# Patient Record
Sex: Male | Born: 1990 | Hispanic: Yes | Marital: Single | Smoking: Current every day smoker
Health system: Southern US, Community
[De-identification: ages and names within clinical notes are randomized; demographics above are authoritative.]

## PROBLEM LIST (undated history)

## (undated) DIAGNOSIS — F1110 Opioid abuse, uncomplicated: Secondary | ICD-10-CM

## (undated) DIAGNOSIS — F1910 Other psychoactive substance abuse, uncomplicated: Secondary | ICD-10-CM

## (undated) DIAGNOSIS — F419 Anxiety disorder, unspecified: Secondary | ICD-10-CM

## (undated) DIAGNOSIS — F329 Major depressive disorder, single episode, unspecified: Secondary | ICD-10-CM

## (undated) DIAGNOSIS — J45909 Unspecified asthma, uncomplicated: Secondary | ICD-10-CM

## (undated) DIAGNOSIS — F32A Depression, unspecified: Secondary | ICD-10-CM

## (undated) HISTORY — DX: Anxiety disorder, unspecified: F41.9

## (undated) HISTORY — DX: Depression, unspecified: F32.A

---
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
## 2011-07-24 ENCOUNTER — Encounter (HOSPITAL_COMMUNITY): Payer: Self-pay | Admitting: *Deleted

## 2011-07-24 ENCOUNTER — Emergency Department (HOSPITAL_COMMUNITY): Payer: Self-pay

## 2011-07-24 ENCOUNTER — Emergency Department (HOSPITAL_COMMUNITY)
Admission: EM | Admit: 2011-07-24 | Discharge: 2011-07-24 | Disposition: A | Discharge status: Home / Self Care | Payer: Self-pay | Attending: Emergency Medicine | Admitting: Emergency Medicine

## 2011-07-24 DIAGNOSIS — S91311A Laceration without foreign body, right foot, initial encounter: Secondary | ICD-10-CM

## 2011-07-24 DIAGNOSIS — F17200 Nicotine dependence, unspecified, uncomplicated: Secondary | ICD-10-CM | POA: Insufficient documentation

## 2011-07-24 DIAGNOSIS — Z23 Encounter for immunization: Secondary | ICD-10-CM | POA: Insufficient documentation

## 2011-07-24 DIAGNOSIS — S91309A Unspecified open wound, unspecified foot, initial encounter: Secondary | ICD-10-CM | POA: Insufficient documentation

## 2011-07-24 DIAGNOSIS — W268XXA Contact with other sharp object(s), not elsewhere classified, initial encounter: Secondary | ICD-10-CM | POA: Insufficient documentation

## 2011-07-24 MED ORDER — CEPHALEXIN 250 MG PO CAPS
500.0000 mg | ORAL_CAPSULE | Freq: Once | ORAL | Status: AC
  Administered 2011-07-24: 500 mg via ORAL
  Filled 2011-07-24: qty 2

## 2011-07-24 MED ORDER — TETANUS-DIPHTH-ACELL PERTUSSIS 5-2.5-18.5 LF-MCG/0.5 IM SUSP
0.5000 mL | Freq: Once | INTRAMUSCULAR | Status: AC
  Administered 2011-07-24: 0.5 mL via INTRAMUSCULAR
  Filled 2011-07-24: qty 0.5

## 2011-07-24 MED ORDER — CEPHALEXIN 500 MG PO CAPS: 500.0000 mg | ORAL_CAPSULE | Freq: Four times a day (QID) | ORAL | Status: AC

## 2011-07-24 NOTE — ED Notes (Signed)
Pt reports stepping on piece of glass pta. Pt with active bleeding from posterior aspect of foot. Denies pain. States it feels numb.

## 2011-07-24 NOTE — ED Provider Notes (Signed)
History    This chart was scribed for Aaron Roots, MD, MD by Smitty Pluck. The patient was seen in room TR10C and the patient's care was started at 4:46PM.   CSN: [id number]  Arrival date & time 07/24/11  1615   None     Chief Complaint  Patient presents with  . Extremity Laceration    (Consider location/radiation/quality/duration/timing/severity/associated sxs/prior treatment) The history is provided by the patient.   Aaron Blankenship is a 21 y.o. male who presents to the Emergency Department complaining of moderate right foot laceration due to steeping on glass at home today. Pt reports that he does not think glass is still in his foot. Symptoms have been constant since onset. Denies radiation. Pt has active bleeding.  Denies allergies to medication.   History reviewed. No pertinent past medical history.  History reviewed. No pertinent past surgical history.  History reviewed. No pertinent family history.  History  Substance Use Topics  . Smoking status: Current Some Day Smoker  . Smokeless tobacco: Not on file  . Alcohol Use: Yes     occ      Review of Systems  Constitutional: Negative for fever and chills.  Respiratory: Negative for shortness of breath.   Gastrointestinal: Negative for nausea and vomiting.  Neurological: Negative for weakness.    Allergies  Review of patient's allergies indicates no known allergies.  Home Medications  No current outpatient prescriptions on file.  BP 113/78  Pulse 85  Temp 98.3 F (36.8 C) (Oral)  Resp 16  SpO2 100%  Physical Exam  Nursing note and vitals reviewed. Constitutional: He is oriented to person, place, and time. He appears well-developed and well-nourished. No distress.  HENT:  Head: Normocephalic and atraumatic.  Eyes: Conjunctivae are normal.  Neck: Neck supple. No tracheal deviation present.  Cardiovascular: Normal rate.   Pulmonary/Chest: Effort normal. No respiratory distress.  Musculoskeletal:  Normal range of motion.  Neurological: He is alert and oriented to person, place, and time.  Skin: Skin is warm and dry.       3 cm laceration of right foot proximal to base of 2nd and 3rd toes  Psychiatric: He has a normal mood and affect. His behavior is normal.    ED Course  Procedures (including critical care time) DIAGNOSTIC STUDIES: Oxygen Saturation is 100% on room air, normal by my interpretation.   5:22PM EDP ordered medication: boostrix 0.5 ml, keflex 500 mg   Dg Foot Complete Right  07/24/2011  *RADIOLOGY REPORT*  Clinical Data: Laceration to the base of the second and third toes.  RIGHT FOOT COMPLETE - 3+ VIEW  Comparison: No priors.  Findings: Three views of the right foot demonstrate no acute fracture, subluxation, dislocation, joint or soft tissue abnormality.  Specifically, no retained radiopaque foreign body is identified.  IMPRESSION: 1.  No acute radiographic abnormality of the right foot.  Original Report Authenticated By: Florencia Reasons, M.D.         MDM  I personally performed the services described in this documentation, which was scribed in my presence. The recorded information has been reviewed and considered. Aaron Roots, MD     LACERATION REPAIR Performed by: Aaron Blankenship Authorized by: Aaron Blankenship Consent: Verbal consent obtained. Risks and benefits: risks, benefits and alternatives were discussed Consent given by: patient Patient identity confirmed: provided demographic data Prepped and Draped in normal sterile fashion Wound explored  Laceration Location: right foot  Laceration Length: 4 cm  No Foreign Bodies seen  or palpated  Anesthesia: local infiltration  Local anesthetic: lidocaine 2% wo epinephrine  Anesthetic total: 5 ml  Irrigation method: syringe Amount of cleaning: standard  Skin closure: 4-0 prolene  Number of sutures: 9  Technique: simple interrupted  Patient tolerance: Patient tolerated the procedure well  with no immediate complications.   Confirmed nkda. Keflex po. Tetanus im.     Aaron Roots, MD 07/24/11 [phone]

## 2011-07-24 NOTE — ED Notes (Signed)
Pt reports stepping on glass today.  Noted to have lacerations across the bottom of all toes.  Bleeding controlled.  Pt has sensation and movement.

## 2013-10-30 ENCOUNTER — Encounter (HOSPITAL_COMMUNITY): Payer: Self-pay | Admitting: Emergency Medicine

## 2013-10-30 ENCOUNTER — Emergency Department (HOSPITAL_COMMUNITY)
Admission: EM | Admit: 2013-10-30 | Discharge: 2013-10-31 | Disposition: A | Discharge status: Home / Self Care | Payer: Self-pay | Attending: Emergency Medicine | Admitting: Emergency Medicine

## 2013-10-30 ENCOUNTER — Emergency Department (HOSPITAL_COMMUNITY): Payer: Self-pay

## 2013-10-30 DIAGNOSIS — R0602 Shortness of breath: Secondary | ICD-10-CM

## 2013-10-30 DIAGNOSIS — J069 Acute upper respiratory infection, unspecified: Secondary | ICD-10-CM | POA: Insufficient documentation

## 2013-10-30 DIAGNOSIS — Z720 Tobacco use: Secondary | ICD-10-CM | POA: Insufficient documentation

## 2013-10-30 LAB — CBC WITH DIFFERENTIAL/PLATELET
Basophils Absolute: 0 10*3/uL (ref 0.0–0.1)
Basophils Relative: 0 % (ref 0–1)
EOS ABS: 0.2 10*3/uL (ref 0.0–0.7)
EOS PCT: 2 % (ref 0–5)
HEMATOCRIT: 43.3 % (ref 39.0–52.0)
HEMOGLOBIN: 15 g/dL (ref 13.0–17.0)
LYMPHS ABS: 3.3 10*3/uL (ref 0.7–4.0)
Lymphocytes Relative: 45 % (ref 12–46)
MCH: 28.9 pg (ref 26.0–34.0)
MCHC: 34.6 g/dL (ref 30.0–36.0)
MCV: 83.4 fL (ref 78.0–100.0)
MONO ABS: 0.5 10*3/uL (ref 0.1–1.0)
Monocytes Relative: 6 % (ref 3–12)
Neutro Abs: 3.5 10*3/uL (ref 1.7–7.7)
Neutrophils Relative %: 47 % (ref 43–77)
PLATELETS: 257 10*3/uL (ref 150–400)
RBC: 5.19 MIL/uL (ref 4.22–5.81)
RDW: 12.8 % (ref 11.5–15.5)
WBC: 7.5 10*3/uL (ref 4.0–10.5)

## 2013-10-30 LAB — COMPREHENSIVE METABOLIC PANEL
ALT: 27 U/L (ref 0–53)
ANION GAP: 15 (ref 5–15)
AST: 27 U/L (ref 0–37)
Albumin: 4.3 g/dL (ref 3.5–5.2)
Alkaline Phosphatase: 95 U/L (ref 39–117)
BUN: 18 mg/dL (ref 6–23)
CALCIUM: 10 mg/dL (ref 8.4–10.5)
CO2: 25 mEq/L (ref 19–32)
CREATININE: 1.21 mg/dL (ref 0.50–1.35)
Chloride: 98 mEq/L (ref 96–112)
GFR calc non Af Amer: 84 mL/min — ABNORMAL LOW (ref >90)
GLUCOSE: 107 mg/dL — AB (ref 70–99)
Potassium: 3.9 mEq/L (ref 3.7–5.3)
SODIUM: 138 meq/L (ref 137–147)
TOTAL PROTEIN: 8 g/dL (ref 6.0–8.3)
Total Bilirubin: 0.6 mg/dL (ref 0.3–1.2)

## 2013-10-30 MED ORDER — BENZONATATE 100 MG PO CAPS
200.0000 mg | ORAL_CAPSULE | Freq: Once | ORAL | Status: AC
  Administered 2013-10-30: 200 mg via ORAL
  Filled 2013-10-30: qty 2

## 2013-10-30 NOTE — ED Notes (Signed)
Pt. reports SOB , occasional productive cough , dizziness onset today , denies fever or chills.

## 2013-10-30 NOTE — ED Provider Notes (Signed)
CSN: [id number]     Arrival date & time 10/30/13  2222 History   First MD Initiated Contact with Patient 10/30/13 2303     Chief Complaint  Patient presents with  . Shortness of Breath  . Dizziness     (Consider location/radiation/quality/duration/timing/severity/associated sxs/prior Treatment) Patient is a 23 y.o. male presenting with cough. The history is provided by the patient.  Cough Cough characteristics:  Productive Sputum characteristics:  Nondescript Severity:  Moderate Onset quality:  Gradual Duration:  4 days Timing:  Intermittent Progression:  Unchanged Chronicity:  New Smoker: yes   Context: upper respiratory infection   Relieved by:  Nothing Worsened by:  Nothing tried Ineffective treatments:  None tried Associated symptoms: rhinorrhea   Associated symptoms: no chest pain, no diaphoresis and no fever   Risk factors: no recent travel   No swelling in the legs no long car trips or plane trips.  Has had nasal congestion and runny nose.    History reviewed. No pertinent past medical history. History reviewed. No pertinent past surgical history. No family history on file. History  Substance Use Topics  . Smoking status: Current Some Day Smoker  . Smokeless tobacco: Not on file  . Alcohol Use: Yes     Comment: occ    Review of Systems  Constitutional: Negative for fever and diaphoresis.  HENT: Positive for rhinorrhea.   Respiratory: Positive for cough.   Cardiovascular: Negative for chest pain, palpitations and leg swelling.  Gastrointestinal: Negative for abdominal distention.  All other systems reviewed and are negative.     Allergies  Review of patient's allergies indicates no known allergies.  Home Medications   Prior to Admission medications   Not on File   BP 134/88  Pulse 81  Temp(Src) 98 F (36.7 C) (Oral)  Resp 20  Ht 5\' 9"  (1.753 m)  Wt 170 lb (77.111 kg)  BMI 25.09 kg/m2  SpO2 100% Physical Exam  Constitutional: He is oriented  to person, place, and time. He appears well-nourished. No distress.  HENT:  Head: Normocephalic and atraumatic.  Mouth/Throat: Oropharynx is clear and moist. No oropharyngeal exudate.  Nasal crusting  Eyes: Conjunctivae and EOM are normal. Pupils are equal, round, and reactive to light.  Neck: Normal range of motion. Neck supple.  Cardiovascular: Normal rate, regular rhythm and intact distal pulses.   Pulmonary/Chest: Effort normal and breath sounds normal. No respiratory distress. He has no wheezes. He has no rales. He exhibits no tenderness.  Abdominal: Soft. Bowel sounds are normal. There is no tenderness. There is no rebound and no guarding.  Musculoskeletal: Normal range of motion.  Neurological: He is alert and oriented to person, place, and time.  Skin: Skin is warm and dry.  Psychiatric: He has a normal mood and affect.    ED Course  Procedures (including critical care time) Labs Review Labs Reviewed  CBC WITH DIFFERENTIAL  COMPREHENSIVE METABOLIC PANEL    Imaging Review No results found.   EKG Interpretation   Date/Time:  Thursday October 30 2013 22:28:49 EDT Ventricular Rate:  78 PR Interval:  162 QRS Duration: 88 QT Interval:  354 QTC Calculation: 403 R Axis:   83 Text Interpretation:  Normal sinus rhythm with sinus arrhythmia Confirmed  by [facility] IncALUMBO-RASCH  MD, Ermon Sagan ([id number]) on 10/30/2013 11:03:03 PM      MDM   Final diagnoses:  SOB (shortness of breath)  PERC negative wells 0 highly doubt PE.  Highly doubt ACS in a young healthy male.  Symptoms consistent with URI.  Will treat for same.  Return for CP sob n/v/d.  Swelling in the legs or any concerns.  Follow up with your PMD for recheck   Marti Acebo K Neven Fina-Rasch, MD 10/31/13 [phone]

## 2013-10-31 MED ORDER — FLUTICASONE PROPIONATE 50 MCG/ACT NA SUSP: 2.0000 | Freq: Every day | NASAL | Status: DC

## 2013-10-31 MED ORDER — CETIRIZINE HCL 10 MG PO CAPS: 1.0000 | ORAL_CAPSULE | Freq: Every morning | ORAL | Status: DC

## 2013-10-31 NOTE — ED Notes (Signed)
Pt. Refused wheelchair 

## 2013-10-31 NOTE — Discharge Instructions (Signed)
Vaporizadores de Soil scientistaire fro Clinical research associate([employer]apor[employer]) Los vaporizadores ayudan a Paramedicaliviar los sntomas de la tos y Metallurgistel resfro. Agregan humedad al aire, lo que fluidifica el moco y lo hace menos espeso. Facilitan la respiracin y favorecen la eliminacin de secreciones. Los vaporizadores de aire fro no provocan quemaduras serias Lubrizol Corporationcomo los de aire caliente, que tambin se llaman humidificadores. No se ha probado que los vaporizadores mejoren el resfro. No debe usar un vaporizador si es Pharmacologistalrgico al moho. INSTRUCCIONES PARA EL CUIDADO EN EL HOGAR  Siga las instrucciones para el uso del vaporizador que se encuentran en la caja.  Use solamente agua destilada en el vaporizador.  No use el vaporizador en forma continua. Esto puede formar moho o hacer que se desarrollen bacterias en el vaporizador.  Limpie el vaporizador cada vez que se use.  Lmpielo y squelo bien antes de guardarlo.  Deje de usarlo si los sntomas respiratorios empeoran. Document Released: 08/28/2012 Document Revised: 12/31/2012 [facility] Patient Information 2015 [place]ExitCare, MarylandLL[place]. This information is not intended to replace advice given to you by your health care provider. Make sure you discuss any questions you have with your health care provider.  Tos - Adultos  (Cough, Adult)  La tos es un reflejo. Ayuda a limpiar la garganta y las vas areas. Ayuda a que el organismo se cure. La tos puede durar entre 2  3 semanas (aguda) o puede durar ms de 8 semanas (crnica) Las causas ms frecuentes son las infecciones, las alergias o los resfros.  CUIDADOS EN EL HOGAR   Slo tome la medicacin segn las indicaciones.  Si le indican medicamentos (antibiticos), tmelos tal como se le indic. Tmelos todos, aunque se sienta mejor.  Coloque un vaporizador o humidificador de niebla fra en su casa. Esto puede ayudar a Runner, broadcasting/film/videoaflojar el moco espeso (secreciones).  Duerma de modo que quede casi sentado (semi erguido). Use almohadas para lograr esta  posicin. Esto ayuda a disminuir la tos.  Descanse todo lo que pueda.  Si fuma, abandone el hbito. SOLICITE AYUDA DE INMEDIATO SI:   Observa una secrecin de color blanco amarillento (pus) en el moco.  La tos empeora.  El medicamento no disminuye la tos y no puede dormir.  Tose y escupe sangre.  Tiene dificultad para respirar.  Siente un dolor ms intenso y los medicamentos no Winn-Dixiehacen efecto.  Tiene fiebre. ASEGRESE DE QUE:   Comprende estas instrucciones.  Controlar su enfermedad.  Solicitar ayuda de inmediato si no mejora o si empeora. Document Released: 09/08/2010 Document Revised: 03/20/2011 [facility]ExitCare Patient Information 2015 [place]ExitCare, [place]LLC. This information is not intended to replace advice given to you by your health care provider. Make sure you discuss any questions you have with your health care provider.

## 2014-02-04 ENCOUNTER — Ambulatory Visit (INDEPENDENT_AMBULATORY_CARE_PROVIDER_SITE_OTHER): Payer: Self-pay | Admitting: Family Medicine

## 2014-02-04 VITALS — BP 120/70 | HR 75 | Temp 98.2°F | Resp 16 | Ht 69.0 in | Wt 169.4 lb

## 2014-02-04 DIAGNOSIS — F32A Depression, unspecified: Secondary | ICD-10-CM

## 2014-02-04 DIAGNOSIS — F1910 Other psychoactive substance abuse, uncomplicated: Secondary | ICD-10-CM

## 2014-02-04 DIAGNOSIS — F329 Major depressive disorder, single episode, unspecified: Secondary | ICD-10-CM

## 2014-02-04 DIAGNOSIS — F419 Anxiety disorder, unspecified: Principal | ICD-10-CM

## 2014-02-04 DIAGNOSIS — F418 Other specified anxiety disorders: Secondary | ICD-10-CM

## 2014-02-04 DIAGNOSIS — F1020 Alcohol dependence, uncomplicated: Secondary | ICD-10-CM

## 2014-02-04 MED ORDER — SERTRALINE HCL 50 MG PO TABS: ORAL_TABLET | ORAL | Status: DC

## 2014-02-04 MED ORDER — HYDROXYZINE HCL 25 MG PO TABS: 25.0000 mg | ORAL_TABLET | Freq: Three times a day (TID) | ORAL | Status: DC | PRN

## 2014-02-04 NOTE — Progress Notes (Signed)
Chief Complaint:  Chief Complaint  Patient presents with  . Insomnia  . Neck Pain  . Fatigue  . Anxiety    HPI: Aaron Blankenship is a 24 y.o. male who is here for  Insomnia, does not have any energy to do anything, he is thinking too much and cannot focus and can't sleep. He is stressed and he does not know why. His mind just won;t stop thinking , alittle bit of everything, his mom and sister have anxiety and depression. His sister was taking meds for it but he doe snot know. He is getting about 6 hours for the last 2-3 weeks. He has a hard time falling asleep and then when goes to sleep he will sleep for 2-3 hours and then wakes up He does not want to go to work and does not want to leave her room. He is not hanging out with his friend. He works in Dietitian. He used to not want to be at home. He  Got paind and lost 200 dollars and did not know what happened, he was crying and could not stop crying.  He does not gamble He drinks only on the weekends He drinks until he passes out, he drinks because it is the only time he feels good.  He used to be in a gang, he is not in contact with any of them, they are gone from [place].  He does not have GF, he doe snot have a lot of friends he hangs out with, he has 2 parents and 2 younger siblings He thinks everything pisses him off He stopped going to work because of this.  He is not feeling very well. He is having crying outburst. He used to go to the gym but not anymore before all this.  He has used drugs in the past, cocaine, weed. He is using weed regular 1-2 times a week. He spends most of his money on alcohol.  Last time he used cociane was 1 week ago. He uses cocaine about regular.  History reviewed. No pertinent past medical history. History reviewed. No pertinent past surgical history. History   Social History  . Marital Status: Single    Spouse Name: N/A    Number of Children: N/A  . Years of Education: N/A   Social History  Main Topics  . Smoking status: Current Some Day Smoker  . Smokeless tobacco: Never Used  . Alcohol Use: 0.0 oz/week    0 Not specified per week     Comment: occ  . Drug Use: No  . Sexual Activity: None   Other Topics Concern  . None   Social History Narrative   Family History  Problem Relation Age of Onset  . Depression Father   . Depression Sister    No Known Allergies Prior to Admission medications   Not on File     ROS: The patient denies fevers, chills, night sweats, unintentional weight loss, chest pain, palpitations, wheezing, dyspnea on exertion, nausea, vomiting, abdominal pain, dysuria, hematuria, melena, numbness, weakness, or tingling.   All other systems have been reviewed and were otherwise negative with the exception of those mentioned in the HPI and as above.    PHYSICAL EXAM: Filed Vitals:   02/04/14 1752  BP: 120/70  Pulse: 75  Temp: 98.2 F (36.8 C)  Resp: 16   Filed Vitals:   02/04/14 1752  Height:  (1.753 m)  Weight: 169 lb 6 oz (76.828 kg)  Body mass index is 25 kg/(m^2).  General: Alert, no acute distress HEENT:  Normocephalic, atraumatic, oropharynx patent. EOMI, PERRLA Cardiovascular:  Regular rate and rhythm, no rubs murmurs or gallops.  No Carotid bruits, radial pulse intact. No pedal edema.  Respiratory: Clear to auscultation bilaterally.  No wheezes, rales, or rhonchi.  No cyanosis, no use of accessory musculature GI: No organomegaly, abdomen is soft and non-tender, positive bowel sounds.  No masses. Skin: No rashes. Neurologic: Facial musculature symmetric. Psychiatric: Patient is appropriate throughout our interaction but would burst out into tears.  Lymphatic: No cervical lymphadenopathy Musculoskeletal: Gait intact.   LABS: Results for orders placed or performed during the hospital encounter of 10/30/13  CBC with Differential  Result Value Ref Range   WBC 7.5 4.0 - 10.5 K/uL   RBC 5.19 4.22 - 5.81 MIL/uL   Hemoglobin  15.0 13.0 - 17.0 g/dL   HCT 16.143.3 09.639.0 - 04.552.0 %   MCV 83.4 78.0 - 100.0 fL   MCH 28.9 26.0 - 34.0 pg   MCHC 34.6 30.0 - 36.0 g/dL   RDW 40.912.8 81.111.5 - 91.415.5 %   Platelets 257 150 - 400 K/uL   Neutrophils Relative % 47 43 - 77 %   Neutro Abs 3.5 1.7 - 7.7 K/uL   Lymphocytes Relative 45 12 - 46 %   Lymphs Abs 3.3 0.7 - 4.0 K/uL   Monocytes Relative 6 3 - 12 %   Monocytes Absolute 0.5 0.1 - 1.0 K/uL   Eosinophils Relative 2 0 - 5 %   Eosinophils Absolute 0.2 0.0 - 0.7 K/uL   Basophils Relative 0 0 - 1 %   Basophils Absolute 0.0 0.0 - 0.1 K/uL  Comprehensive metabolic panel  Result Value Ref Range   Sodium 138 137 - 147 mEq/L   Potassium 3.9 3.7 - 5.3 mEq/L   Chloride 98 96 - 112 mEq/L   CO2 25 19 - 32 mEq/L   Glucose, Bld 107 (H) 70 - 99 mg/dL   BUN 18 6 - 23 mg/dL   Creatinine, Ser 7.821.21 0.50 - 1.35 mg/dL   Calcium 95.610.0 8.4 - 21.310.5 mg/dL   Total Protein 8.0 6.0 - 8.3 g/dL   Albumin 4.3 3.5 - 5.2 g/dL   AST 27 0 - 37 U/L   ALT 27 0 - 53 U/L   Alkaline Phosphatase 95 39 - 117 U/L   Total Bilirubin 0.6 0.3 - 1.2 mg/dL   GFR calc non Af Amer 84 (L) >90 mL/min   GFR calc Af Amer >90 >90 mL/min   Anion gap 15 5 - 15     EKG/XRAY:   [facility] Normla sinus brady EKG at 54 bpm   ASSESSMENT/PLAN: Encounter Diagnoses  Name Primary?  . Substance abuse   . Uncomplicated alcohol dependence   . 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 Deneis mania.  Rx zoloft nightly  and vistaril prn  Gave resources to go to substance abuse counseling F/u in 6 weeks or prn  Denies any  SI/HI/ Hallucinations, precautions to go to ER prn EKG was WNL sinusbrady  Gross sideeffects, risk and benefits, and alternatives of medications d/w patient. Patient is  aware that all medications have potential sideeffects and we  are unable to predict every sideeffect or drug-drug interaction that may occur.  Hamilton Capri PHUONG, DO 02/04/2014 6:41 PM

## 2014-02-04 NOTE — Patient Instructions (Signed)
RESOURCE GUIDE ° °Chronic Pain Problems: °Contact [facility]  [phone] °Patients need to be referred by their primary care doctor. ° °Insufficient Money for Medicine: °Contact United Way:  call (888) 892-1162 ° °No Primary Care Doctor: °- Call [facility]  [phone] - can help you locate a primary care doctor that  accepts your insurance, provides certain services, etc. °- Physician Referral Service- 1-800-533-3463 ° °Agencies that provide inexpensive medical care: °- [facility]  [phone] °- [facility]  [phone] °- [facility]  [phone] °- [facility]  832-4777 °- [facility]  [phone] °- [facility]  [phone] ° °Medicaid-accepting [place] Providers: °- [facility]- [address] ° [phone], Mon-Fri 9am-7pm, Sat 9am-1pm °- [facility]- [address], [address] ° [phone] °- [facility]- [address], [address] ° [phone] °- [facility]- [address] [place] [address] ° [phone] °- [facility]- [address], [phone] ° Only accepts [place] Access Medicaid patients after they have their name  applied to their card ° °Self Pay (no insurance) in [place]: °- Sickle Cell Patients - [facility] ° [address], [phone] °- [facility]- [address] ° [phone] °      -     [facility]- [address] °      -     [facility]- see information above (Speak to Pam H if you do not have insurance) °      -  [facility] [place]- [address],  [phone] °      -  [facility]- [address] [place] [address], [phone] °      -  Dr Osei-Bonsu-  [address], [place], [phone] °      -  [facility] - [address], [phone] °      -  [facility]- [address] [place] [address], [phone], also  [address], [phone] °      -     [facility]- [address], [phone], 1st & 3rd Saturday °        every month, 10am-1pm ° -     [facility] °  [address], [place]. °  Phone:  [phone], Fax:  [phone]. Hours of Operation:  9 am - 6 pm, M-F. ° -     [facility] °  [address], [place] °  Phone: [phone], Fax: [phone]. Hours of Operation:  8:30 am - 5:30 pm, M-F. ° °[facility] °[address] °[place], [place] [address] °(336) [phone] ° °[facility] °[address] °[place], [place] [address] °(336) [phone] ° °1) Find a Doctor and Pay Out of Pocket °Although you won't have to find out who is covered by your insurance plan, it is a good idea to ask around and get recommendations. You will then need to call the office and see if the doctor you have chosen will accept you as a new patient and what types of options they offer for patients who are self-pay. Some doctors offer discounts or will set up payment plans for their patients who do not have insurance, but   you will need to ask so you aren't surprised when you get to your appointment. ° °2) Contact Your Local Health Department °Not all health departments have doctors that can see patients for sick visits, but many do, so it is worth a call to see if yours does. If you don't know where your local health department is, you can check in your phone book. The CDC also has a tool to help you locate your state's health department, and many state websites also have listings of all of their local health departments. ° °3) Find a Walk-in Clinic °If your illness is not likely to be very severe or complicated, you may want to try a walk in clinic. These are popping up all over the country in pharmacies, drugstores, and shopping centers. They're usually staffed by nurse practitioners or physician assistants that have been trained  to treat common illnesses and complaints. They're usually fairly quick and inexpensive. However, if you have serious medical issues or chronic medical problems, these are probably not your best option ° °STD Testing °- [place] [facility] , [facility], [address], [place], phone [phone] or 1-877-539-9860.  Monday - Friday, call for an appointment. °- [place] [facility] [place], [address], [place], phone [phone] or 1-877-539-9860.  Monday - Friday, call for an appointment. ° °Abuse/Neglect: °- [place] Child Abuse Hotline (336) 641-3795 °- [place] Child Abuse Hotline 800-378-5315 (After Hours) ° °Emergency Shelter:  [facility] (336) 271-5985 ° °Maternity Homes: °- [facility] (336) 275-9566 °- [facility] (704) 372-4663 ° °MRSA Hotline #:   832-7006 ° °Dental Assistance °If unable to pay or uninsured, contact:  [place] [facility] to become qualified for the adult dental clinic. ° °Patients with Medicaid: [facility] °[address], [phone] °[address], [phone] ° °If unable to pay, or uninsured, contact [place] [facility] ([phone] in [place], 842-7733 in [place]) to become qualified for the adult dental clinic ° °[facility] °[address] °[place], [place] [address] °(336) 272-4177 °www.[facility].com ° °Other Low-Cost Community Dental Services: °- [facility]- [address], [place], [place], [address], 723-1848, Ext. [address], 2nd and 4th Thursday of the month at 6:30am.  10 clients each day by appointment, can sometimes see walk-in patients if someone does not show for an appointment. °- [facility]- [address], [place], [place], [address], [phone] °- [facility]- [address], [place], [place], [address], [phone] °- [place]  [facility]- 342-8273 °- [facility]- 703-3100 °- [facility]- 570-6415 ° °     Behavioral Health Resources in the Community ° °Intensive Outpatient Programs: °[facility]      °[address] °[place], [place] °336-878-6098 °Both a day and evening program °      °[facility]     °[address]        °[place], [place] [address] °336-832-9800        ° °[facility] °[address] °[place] °336-882-2125 ° °[place] [place] [facility] °ACCESS LINE: 1-800-853-5163 or 336-641-4981 °[address] °,  [address] °Http://www.[email or web address].com/services/adult.htm ° ° °Substance Abuse Resources: °- [facility]  336-882-2125 °- [facility] 336-784-9470 °- [facility] [place] [facility] 336-285-9073 °- [facility] 336-845-3988 °-   Residential & Outpatient Substance Abuse Program  800-659-3381 ° °Psychological Services: °- [facility]  832-9600 °- [facility]  378-7881 °- [facility], [address], [place], ACCESS LINE: 1-800-853-5163 or 336-641-4981, Http://www.[facility].com/services/adult.htm ° °Mobile Crisis Teams:         °                               °[facility]         °Mobile Crisis Care Unit °1-877-626-1772       °      °Assertive °[facility] °[address] [place] °336-834-9664 °                                        °[facility]vention[facility] °Sharon DeEsch °[address] °[place] °336-554-5454 ° °Self-Help/Support Groups: °[facility] [place] [facility] of support groups °373-1402 (call for more info) ° °Narcotics Anonymous (NA) °[facility] °[address] °[place] - 2 meetings at this location ° °Residential Treatment Programs:  °[facility]      °[address]        °[place]       °866-801-8205        ° °[facility] °[address] °[place], [place]  [address] °704-293-8524 ° °[facility]  °[address] °[place], [place] [address] °336-845-3988 °Admissions: 8am-3pm M-F ° °[facility]     °[address]        °[place], [place] [address]       °336-841-1104        ° °[facility] °[address] °[place], [place] °336-379-7146 ° °[facility] [place] [facility] °[address] °[place], [place] °336-285-9073 ° °[facility] - Intensive Outpatient      °[address]     °, [place]       °852-3033        ° °[facility]     °[address] °[place], [place] °877-615-2722 or 336-784-9470 ° °[facility] ([facility]), Medicaid °[address] °[place], [place] °336-227-7417 ° °[facility]                                               °[address] ° [place] °800-659-3381 ° °[place] [facility]: °[facility]- 1-888-581-9988              ° °General Therapy                                                °Julie Brannon, PhD        °[address]                                       °[place], [place] [address]         °336-349-5553   °Insurance ° °[facility]   °[address] °[place], [place] [address] °336-349-4454 ° °[facility] °[address] [place], [place] [address] °336-342-8316 °Insurance/Medicaid/sponsorship through [facility] ° °[facility]                                              °[address]                                        °[place], [place] [address]    °Therapy/tele-psych/case         °336-342-8316        °  °[facility] °[address]  ° [place], [place]  [address]  °Adolescent/group home/case management °336-349-2233  °                                         °Julia Brannon PhD       °General therapy       °Insurance   °336-951-0000        ° °Dr. Arfeen, Insurance, M-F °336- 349-4544 ° °[facility] [place]  [facility]  [employer] [place] [facility] °[address]                 [address]         [address]  °[place]                                               [place] °Phone:  349-3220                                  Phone:  342-7768                   Phone:  342-8140 ° °[facility], 342-8316 °- [place] [facility] - [facility]- 1-888-581-9988 °      -     [facility] in [place], [address], °            336-349-4454, Insurance ° °[place] Child Abuse Hotline °(336) 342-1394 or (336) 342-3537 (After Hours) ° ° °

## 2014-03-05 ENCOUNTER — Ambulatory Visit: Payer: Self-pay

## 2016-08-12 ENCOUNTER — Emergency Department (HOSPITAL_COMMUNITY)
Admission: EM | Admit: 2016-08-12 | Discharge: 2016-08-12 | Disposition: A | Discharge status: Home / Self Care | Payer: Self-pay | Attending: Emergency Medicine | Admitting: Emergency Medicine

## 2016-08-12 ENCOUNTER — Encounter (HOSPITAL_COMMUNITY): Payer: Self-pay | Admitting: Emergency Medicine

## 2016-08-12 ENCOUNTER — Emergency Department (HOSPITAL_COMMUNITY): Payer: Self-pay

## 2016-08-12 DIAGNOSIS — R112 Nausea with vomiting, unspecified: Secondary | ICD-10-CM | POA: Insufficient documentation

## 2016-08-12 DIAGNOSIS — R1030 Lower abdominal pain, unspecified: Secondary | ICD-10-CM | POA: Insufficient documentation

## 2016-08-12 DIAGNOSIS — Z87891 Personal history of nicotine dependence: Secondary | ICD-10-CM | POA: Insufficient documentation

## 2016-08-12 DIAGNOSIS — Z79899 Other long term (current) drug therapy: Secondary | ICD-10-CM | POA: Insufficient documentation

## 2016-08-12 DIAGNOSIS — R197 Diarrhea, unspecified: Secondary | ICD-10-CM | POA: Insufficient documentation

## 2016-08-12 LAB — COMPREHENSIVE METABOLIC PANEL
ALT: 23 U/L (ref 17–63)
AST: 23 U/L (ref 15–41)
Albumin: 4.9 g/dL (ref 3.5–5.0)
Alkaline Phosphatase: 82 U/L (ref 38–126)
Anion gap: 11 (ref 5–15)
BUN: 8 mg/dL (ref 6–20)
CHLORIDE: 104 mmol/L (ref 101–111)
CO2: 25 mmol/L (ref 22–32)
CREATININE: 1.02 mg/dL (ref 0.61–1.24)
Calcium: 9.8 mg/dL (ref 8.9–10.3)
GFR calc non Af Amer: >60 mL/min (ref >60)
Glucose, Bld: 163 mg/dL — ABNORMAL HIGH (ref 65–99)
Potassium: 3.2 mmol/L — ABNORMAL LOW (ref 3.5–5.1)
SODIUM: 140 mmol/L (ref 135–145)
Total Bilirubin: 0.4 mg/dL (ref 0.3–1.2)
Total Protein: 8.3 g/dL — ABNORMAL HIGH (ref 6.5–8.1)

## 2016-08-12 LAB — CBC
HCT: 46.1 % (ref 39.0–52.0)
Hemoglobin: 16.3 g/dL (ref 13.0–17.0)
MCH: 29.5 pg (ref 26.0–34.0)
MCHC: 35.4 g/dL (ref 30.0–36.0)
MCV: 83.4 fL (ref 78.0–100.0)
PLATELETS: 231 10*3/uL (ref 150–400)
RBC: 5.53 MIL/uL (ref 4.22–5.81)
RDW: 13.3 % (ref 11.5–15.5)
WBC: 16 10*3/uL — ABNORMAL HIGH (ref 4.0–10.5)

## 2016-08-12 LAB — LIPASE, BLOOD: LIPASE: 14 U/L (ref 11–51)

## 2016-08-12 LAB — URINALYSIS, ROUTINE W REFLEX MICROSCOPIC
Bilirubin Urine: NEGATIVE
GLUCOSE, UA: NEGATIVE mg/dL
HGB URINE DIPSTICK: NEGATIVE
Ketones, ur: NEGATIVE mg/dL
Leukocytes, UA: NEGATIVE
Nitrite: NEGATIVE
PROTEIN: NEGATIVE mg/dL
Specific Gravity, Urine: >1.046 — ABNORMAL HIGH (ref 1.005–1.030)
pH: 7 (ref 5.0–8.0)

## 2016-08-12 LAB — DIFFERENTIAL
Basophils Absolute: 0 10*3/uL (ref 0.0–0.1)
Basophils Relative: 0 %
EOS PCT: 0 %
Eosinophils Absolute: 0 10*3/uL (ref 0.0–0.7)
LYMPHS ABS: 1.8 10*3/uL (ref 0.7–4.0)
LYMPHS PCT: 11 %
MONO ABS: 0.6 10*3/uL (ref 0.1–1.0)
MONOS PCT: 4 %
Neutro Abs: 13.6 10*3/uL — ABNORMAL HIGH (ref 1.7–7.7)
Neutrophils Relative %: 85 %

## 2016-08-12 MED ORDER — IOPAMIDOL (ISOVUE-300) INJECTION 61%
INTRAVENOUS | Status: AC
  Filled 2016-08-12: qty 100

## 2016-08-12 MED ORDER — MORPHINE SULFATE (PF) 4 MG/ML IV SOLN
4.0000 mg | Freq: Once | INTRAVENOUS | Status: AC
  Administered 2016-08-12: 4 mg via INTRAVENOUS
  Filled 2016-08-12: qty 1

## 2016-08-12 MED ORDER — IOPAMIDOL (ISOVUE-300) INJECTION 61%
100.0000 mL | Freq: Once | INTRAVENOUS | Status: AC | PRN
  Administered 2016-08-12: 100 mL via INTRAVENOUS

## 2016-08-12 MED ORDER — ONDANSETRON HCL 4 MG PO TABS: 4.0000 mg | ORAL_TABLET | Freq: Three times a day (TID) | ORAL | 0 refills | Status: AC | PRN

## 2016-08-12 MED ORDER — SODIUM CHLORIDE 0.9 % IV BOLUS (SEPSIS)
1000.0000 mL | Freq: Once | INTRAVENOUS | Status: AC
  Administered 2016-08-12: 1000 mL via INTRAVENOUS

## 2016-08-12 MED ORDER — POTASSIUM CHLORIDE CRYS ER 20 MEQ PO TBCR
40.0000 meq | EXTENDED_RELEASE_TABLET | Freq: Once | ORAL | Status: AC
  Administered 2016-08-12: 40 meq via ORAL
  Filled 2016-08-12: qty 2

## 2016-08-12 MED ORDER — KETOROLAC TROMETHAMINE 30 MG/ML IJ SOLN
30.0000 mg | Freq: Once | INTRAMUSCULAR | Status: AC
  Administered 2016-08-12: 30 mg via INTRAVENOUS
  Filled 2016-08-12: qty 1

## 2016-08-12 MED ORDER — ONDANSETRON HCL 4 MG/2ML IJ SOLN
4.0000 mg | Freq: Once | INTRAMUSCULAR | Status: AC
  Administered 2016-08-12: 4 mg via INTRAVENOUS
  Filled 2016-08-12: qty 2

## 2016-08-12 NOTE — ED Triage Notes (Addendum)
Pt states he has had vomiting for the past 4 hrs  Pt states it is just liquid but it has been nonstop  Pt is c/o generalized abd pain  Pt heaving in triage and unable to hold still  Pt is diaphoretic in triage

## 2016-08-12 NOTE — Discharge Instructions (Signed)
Take Zofran as needed up to 3 times daily for nausea and vomiting. Drink plenty of fluids and get plenty of rest. Slowly advance her diet and eat a diet of bland foods that will not upset her stomach. You may find yogurt or probiotics helpful additionally. Follow-up with your primary care physician for reevaluation in the next 3-4 days. Return to the ED immediately for any concerning signs or symptoms develop such as persisting nausea and vomiting, fevers, severe pain, or blood in the urine or stool.

## 2016-08-12 NOTE — ED Provider Notes (Signed)
[facility]-EMERGENCY DEPT Provider Note   CSN: [id number] Arrival date & time: 08/12/16  [id number]     History   Chief Complaint Chief Complaint  Patient presents with  . Emesis    HPI Aaron Blankenship is a 26 y.o. male with no significant past medical history who presents today with chief complaint acute onset, waxing and waning abdominal pain since 2 AM this morning. Patient states he awoke suddenly with severe crampy lower abdominal pain that at times radiates to his left flank. He has had persistent nausea and vomiting since then. Emesis is nonbloody but bilious. Had 1 episode of watery loose stools while in the waiting room and states that it was red "but that might be because I eat a lot of spicy food with hot sauce ". Episodes will intensify for approximately 5 minutes before remitting somewhat. He denies dysuria, hematuria, or other urinary symptoms. He endorses subjective fevers and chills and states that he is unable to find a comfortable position. Has not been able to keep any food down today. Last oral intake was a pastry. No sick contacts. Wife states that they do eat a lot of fast food and "unhealthy food". No known history of kidney stones. HThe history is provided by the patient and the spouse.  Emesis   Associated symptoms include abdominal pain, chills, diarrhea and a fever.    History reviewed. No pertinent past medical history.  Patient Active Problem List   Diagnosis Date Noted  . ALLERGIC RHINITIS 09/07/2008  . UVEITIS 10/05/2005    History reviewed. No pertinent surgical history.     Home Medications    Prior to Admission medications   Medication Sig Start Date End Date Taking? Authorizing Provider  hydrOXYzine (ATARAX/VISTARIL) 25 MG tablet Take 1 tablet (25 mg total) by mouth 3 (three) times daily as needed for anxiety. Patient not taking: Reported on 08/12/2016 02/04/14   Le, Thao P, DO  ondansetron (ZOFRAN) 4 MG tablet Take 1 tablet (4 mg total) by mouth every 8  (eight) hours as needed for nausea or vomiting. 08/12/16 08/16/16  Michela PitcherFawze, Kapil Petropoulos A, PA-C  sertraline (ZOLOFT) 50 MG tablet Take 1/2 tab PO nightly for 2 days then 1 tab po at night daily Patient not taking: Reported on 08/12/2016 02/04/14   Lenell AntuLe, Thao P, DO    Family History Family History  Problem Relation Age of Onset  . Depression Father   . Depression Sister     Social History Social History  Substance Use Topics  . Smoking status: Former Games developermoker  . Smokeless tobacco: Never Used  . Alcohol use No     Allergies   Patient has no known allergies.   Review of Systems Review of Systems  Constitutional: Positive for chills, diaphoresis and fever.  Respiratory: Negative for shortness of breath.   Cardiovascular: Negative for chest pain.  Gastrointestinal: Positive for abdominal pain, diarrhea, nausea and vomiting.  Genitourinary: Positive for flank pain. Negative for dysuria, frequency and hematuria.  All other systems reviewed and are negative.    Physical Exam Updated Vital Signs BP 121/78 (BP Location: Left Arm)   Pulse 65   Temp 98 F (36.7 C) (Oral)   Resp 18   SpO2 100%   Physical Exam  Constitutional: He appears well-developed and well-nourished. He appears distressed.  Very uncomfortable in bed, cries out in pain occasionally, constantly changing positions  HENT:  Head: Normocephalic and atraumatic.  Eyes: Conjunctivae are normal. Right eye exhibits no discharge. Left eye exhibits  no discharge.  Neck: No JVD present. No tracheal deviation present.  Cardiovascular: Normal rate, regular rhythm and normal heart sounds.   Pulmonary/Chest: Effort normal and breath sounds normal.  Abdominal: Soft. He exhibits no distension. There is tenderness. There is guarding.  Maximally tender in the suprapubic region with some discomfort on RLQ and LLQ palpation, Murphy sign absent, CVA tenderness absent  Musculoskeletal: He exhibits no edema.  No midline spine TTP, no paraspinal  muscle tenderness, no deformity, crepitus, or step-off noted   Neurological: He is alert.  Skin: Skin is warm and dry. He is not diaphoretic. No erythema.  Psychiatric: He has a normal mood and affect. His behavior is normal.  Nursing note and vitals reviewed.    ED Treatments / Results  Labs (all labs ordered are listed, but only abnormal results are displayed) Labs Reviewed  COMPREHENSIVE METABOLIC PANEL - Abnormal; Notable for the following:       Result Value   Potassium 3.2 (*)    Glucose, Bld 163 (*)    Total Protein 8.3 (*)    All other components within normal limits  CBC - Abnormal; Notable for the following:    WBC 16.0 (*)    All other components within normal limits  URINALYSIS, ROUTINE W REFLEX MICROSCOPIC - Abnormal; Notable for the following:    Specific Gravity, Urine >1.046 (*)    All other components within normal limits  DIFFERENTIAL - Abnormal; Notable for the following:    Neutro Abs 13.6 (*)    All other components within normal limits  LIPASE, BLOOD    EKG  EKG Interpretation None       Radiology Ct Abdomen Pelvis W Contrast  Result Date: 08/12/2016 CLINICAL DATA:  Generalized abdominal pain with nausea and vomiting. EXAM: CT ABDOMEN AND PELVIS WITH CONTRAST TECHNIQUE: Multidetector CT imaging of the abdomen and pelvis was performed using the standard protocol following bolus administration of intravenous contrast. CONTRAST:  ISOVUE-300 IOPAMIDOL (ISOVUE-300) INJECTION 61% COMPARISON:  None. FINDINGS: Lower chest: Lung bases are clear. Mild dependent atelectasis in lower lobes. Hepatobiliary: Normal appearance of the liver, gallbladder and portal venous system. No biliary dilatation. Pancreas: Normal appearance of the pancreas without inflammation or duct dilatation. Spleen: Normal appearance of spleen without enlargement. Adrenals/Urinary Tract: Normal adrenal glands. Normal appearance of both kidneys without hydronephrosis. No suspicious renal  lesions. Urinary bladder is grossly normal and decompressed. Stomach/Bowel: Stomach is within normal limits. Appendix appears normal. No evidence of bowel wall thickening, distention, or inflammatory changes. Vascular/Lymphatic: No significant vascular findings are present. No enlarged abdominal or pelvic lymph nodes. Reproductive: Prostate is unremarkable. Other: No free fluid.  No free air. Musculoskeletal: Bilateral pars defects at L5. Minimal anterolisthesis at L5-S1. IMPRESSION: No acute abnormalities in the abdomen or pelvis. Electronically Signed   By: Richarda Overlie M.D.   On: 08/12/2016 08:30    Procedures Procedures (including critical care time)  Medications Ordered in ED Medications  iopamidol (ISOVUE-300) 61 % injection (not administered)  ondansetron (ZOFRAN) injection 4 mg (4 mg Intravenous Given 08/12/16 0702)  sodium chloride 0.9 % bolus 1,000 mL (0 mLs Intravenous Stopped 08/12/16 0837)  ketorolac (TORADOL) 30 MG/ML injection 30 mg (30 mg Intravenous Given 08/12/16 0702)  morphine 4 MG/ML injection 4 mg (4 mg Intravenous Given 08/12/16 0702)  iopamidol (ISOVUE-300) 61 % injection 100 mL (100 mLs Intravenous Contrast Given 08/12/16 0812)  potassium chloride SA (K-DUR,KLOR-CON) CR tablet 40 mEq (40 mEq Oral Given 08/12/16 0917)  Initial Impression / Assessment and Plan / ED Course  I have reviewed the triage vital signs and the nursing notes.  Pertinent labs & imaging results that were available during my care of the patient were reviewed by me and considered in my medical decision making (see chart for details).     Patient with complaint of nausea, vomiting, diarrhea, and crampy abdominal pain acutely at 2 AM this morning. Afebrile, initially slightly hypotensive with improvement while in the ED. He does have a leukocytosis of 16 but no anemia. Slightly hypokalemic which was corrected while in the ED. Otherwise no significant electrolyte abnormalities or decreased urine function. UA not  concerning for UTI or nephrolithiasis. CT of the abdomen shows no evidence of hydronephrosis, nephrolithiasis, or any other significant acute intra-abdominal abnormalities. I doubt mesenteric ischemia in a young patient who is a former smoker but not currently smoking. Doubt appendicitis and the absence of fever or focal tenderness in the right lower quadrant. Symptoms managed with Zofran, fluids, Toradol, and morphine. On reevaluation patient is well appearing and resting comfortably. Repeat abdominal examination is unremarkable. I suspect a gastroenteritis possibly related to the large amount of spicy food the patient consumes. He is stable for discharge home with Zofran. Recommend follow-up with primary care for reevaluation in the next 3-4 days. Discussed indications for return to the ED. Pt verbalized understanding of and agreement with plan and is safe for discharge home at this time.   Final Clinical Impressions(s) / ED Diagnoses   Final diagnoses:  Nausea vomiting and diarrhea    New Prescriptions Discharge Medication List as of 08/12/2016  9:07 AM    START taking these medications   Details  ondansetron (ZOFRAN) 4 MG tablet Take 1 tablet (4 mg total) by mouth every 8 (eight) hours as needed for nausea or vomiting., Starting Sat 08/12/2016, Until Wed 08/16/2016, Print         [place] MaxinFawze, [place] A, PA-C 08/12/16 [id number]    Geoffery Lyonselo, [place], MD 08/12/16 [phone]

## 2019-03-05 ENCOUNTER — Other Ambulatory Visit: Payer: Self-pay

## 2019-03-05 ENCOUNTER — Encounter (HOSPITAL_COMMUNITY): Payer: Self-pay | Admitting: Emergency Medicine

## 2019-03-05 ENCOUNTER — Emergency Department (HOSPITAL_COMMUNITY)
Admission: EM | Admit: 2019-03-05 | Discharge: 2019-03-06 | Disposition: A | Discharge status: Home / Self Care | Payer: Self-pay | Attending: Emergency Medicine | Admitting: Emergency Medicine

## 2019-03-05 ENCOUNTER — Emergency Department (HOSPITAL_COMMUNITY): Payer: Self-pay

## 2019-03-05 DIAGNOSIS — Z5321 Procedure and treatment not carried out due to patient leaving prior to being seen by health care provider: Secondary | ICD-10-CM | POA: Insufficient documentation

## 2019-03-05 DIAGNOSIS — R0602 Shortness of breath: Secondary | ICD-10-CM | POA: Insufficient documentation

## 2019-03-05 LAB — CBC WITH DIFFERENTIAL/PLATELET
Abs Immature Granulocytes: 0.02 10*3/uL (ref 0.00–0.07)
Basophils Absolute: 0 10*3/uL (ref 0.0–0.1)
Basophils Relative: 0 %
Eosinophils Absolute: 0.3 10*3/uL (ref 0.0–0.5)
Eosinophils Relative: 4 %
HCT: 44.9 % (ref 39.0–52.0)
Hemoglobin: 15.5 g/dL (ref 13.0–17.0)
Immature Granulocytes: 0 %
Lymphocytes Relative: 28 %
Lymphs Abs: 2.5 10*3/uL (ref 0.7–4.0)
MCH: 28.9 pg (ref 26.0–34.0)
MCHC: 34.5 g/dL (ref 30.0–36.0)
MCV: 83.6 fL (ref 80.0–100.0)
Monocytes Absolute: 0.6 10*3/uL (ref 0.1–1.0)
Monocytes Relative: 7 %
Neutro Abs: 5.3 10*3/uL (ref 1.7–7.7)
Neutrophils Relative %: 61 %
Platelets: 264 10*3/uL (ref 150–400)
RBC: 5.37 MIL/uL (ref 4.22–5.81)
RDW: 12.4 % (ref 11.5–15.5)
WBC: 8.7 10*3/uL (ref 4.0–10.5)
nRBC: 0 % (ref 0.0–0.2)

## 2019-03-05 MED ORDER — ALBUTEROL SULFATE HFA 108 (90 BASE) MCG/ACT IN AERS
2.0000 | INHALATION_SPRAY | Freq: Once | RESPIRATORY_TRACT | Status: AC
  Administered 2019-03-05: 2 via RESPIRATORY_TRACT
  Filled 2019-03-05: qty 6.7

## 2019-03-05 NOTE — ED Triage Notes (Signed)
Patient reports SOB with productive cough and chest congestion/wheezing onset this week . Denies fever or chills.

## 2019-03-06 ENCOUNTER — Emergency Department (HOSPITAL_COMMUNITY): Payer: Self-pay

## 2019-03-06 LAB — BASIC METABOLIC PANEL
Anion gap: 11 (ref 5–15)
BUN: 15 mg/dL (ref 6–20)
CO2: 29 mmol/L (ref 22–32)
Calcium: 9.5 mg/dL (ref 8.9–10.3)
Chloride: 97 mmol/L — ABNORMAL LOW (ref 98–111)
Creatinine, Ser: 1.05 mg/dL (ref 0.61–1.24)
GFR calc Af Amer: >60 mL/min (ref >60)
GFR calc non Af Amer: >60 mL/min (ref >60)
Glucose, Bld: 100 mg/dL — ABNORMAL HIGH (ref 70–99)
Potassium: 3.7 mmol/L (ref 3.5–5.1)
Sodium: 137 mmol/L (ref 135–145)

## 2019-03-06 NOTE — ED Notes (Signed)
PT called x3, no answer

## 2019-03-08 ENCOUNTER — Emergency Department (HOSPITAL_COMMUNITY): Payer: Self-pay

## 2019-03-08 ENCOUNTER — Emergency Department (HOSPITAL_COMMUNITY)
Admission: EM | Admit: 2019-03-08 | Discharge: 2019-03-08 | Disposition: A | Discharge status: Home / Self Care | Payer: Self-pay | Attending: Emergency Medicine | Admitting: Emergency Medicine

## 2019-03-08 ENCOUNTER — Encounter (HOSPITAL_COMMUNITY): Payer: Self-pay | Admitting: *Deleted

## 2019-03-08 ENCOUNTER — Other Ambulatory Visit: Payer: Self-pay

## 2019-03-08 DIAGNOSIS — Z20822 Contact with and (suspected) exposure to covid-19: Secondary | ICD-10-CM | POA: Insufficient documentation

## 2019-03-08 DIAGNOSIS — F17200 Nicotine dependence, unspecified, uncomplicated: Secondary | ICD-10-CM | POA: Insufficient documentation

## 2019-03-08 DIAGNOSIS — J40 Bronchitis, not specified as acute or chronic: Secondary | ICD-10-CM | POA: Insufficient documentation

## 2019-03-08 HISTORY — DX: Opioid abuse, uncomplicated: F11.10

## 2019-03-08 LAB — RESPIRATORY PANEL BY RT PCR (FLU A&B, COVID)
Influenza A by PCR: NEGATIVE
Influenza B by PCR: NEGATIVE
SARS Coronavirus 2 by RT PCR: NEGATIVE

## 2019-03-08 MED ORDER — AEROCHAMBER PLUS FLO-VU MEDIUM MISC
1.0000 | Freq: Once | Status: AC
  Administered 2019-03-08: 14:00:00 1
  Filled 2019-03-08: qty 1

## 2019-03-08 MED ORDER — ALBUTEROL (5 MG/ML) CONTINUOUS INHALATION SOLN
10.0000 mg/h | INHALATION_SOLUTION | Freq: Once | RESPIRATORY_TRACT | Status: AC
  Administered 2019-03-08: 10 mg/h via RESPIRATORY_TRACT
  Filled 2019-03-08: qty 20

## 2019-03-08 MED ORDER — PREDNISONE 50 MG PO TABS: 50.0000 mg | ORAL_TABLET | Freq: Every day | ORAL | 0 refills | Status: DC

## 2019-03-08 MED ORDER — PREDNISONE 20 MG PO TABS
60.0000 mg | ORAL_TABLET | Freq: Once | ORAL | Status: AC
  Administered 2019-03-08: 13:00:00 60 mg via ORAL
  Filled 2019-03-08: qty 3

## 2019-03-08 MED ORDER — ALBUTEROL SULFATE HFA 108 (90 BASE) MCG/ACT IN AERS
2.0000 | INHALATION_SPRAY | RESPIRATORY_TRACT | Status: DC | PRN
  Administered 2019-03-08: 2 via RESPIRATORY_TRACT
  Filled 2019-03-08: qty 6.7

## 2019-03-08 NOTE — Patient Outreach (Signed)
[facility] tried meeting with the patient in order to provide substance use recovery support and help with substance use treatment resources, but the patient left before wanting to meet with [facility] in-person. 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

## 2019-03-08 NOTE — ED Provider Notes (Signed)
[place] DEPT Provider Note   CSN: [id number] Arrival date & time: 03/08/19  0630     History Chief Complaint  Patient presents with  . Shortness of Breath  . Detox information    Aaron Blankenship is a 29 y.o. male.  HPI  patient presents to the emergency department with shortness of breath and coughing for last 3 days.  The patient states that he did emergency department on February 24 but left before being seen.  He was given 2 puffs and albuterol inhaler and did feel some better but still continues to be short of breath with the cough.  Patient states that he smokes a fair amount 1+ packs per day.  Patient states that he not take any medications prior to arrival.  The patient denies chest pain,  headache,blurred vision, neck pain, fever, weakness, numbness, dizziness, anorexia, edema, abdominal pain, nausea, vomiting, diarrhea, rash, back pain, dysuria, hematemesis, bloody stool, near syncope, or syncope.  Past Medical History:  Diagnosis Date  . Heroin abuse [facility])     Patient Active Problem List   Diagnosis Date Noted  . ALLERGIC RHINITIS 09/07/2008  . UVEITIS 10/05/2005    History reviewed. No pertinent surgical history.     Family History  Problem Relation Age of Onset  . Depression Father   . Depression Sister     Social History   Tobacco Use  . Smoking status: Current Every Day Smoker  . Smokeless tobacco: Never Used  Substance Use Topics  . Alcohol use: No    Alcohol/week: 0.0 standard drinks  . Drug use: Yes    Types: Marijuana    Comment: Heroin    Home Medications Prior to Admission medications   Medication Sig Start Date End Date Taking? Authorizing Provider  hydrOXYzine (ATARAX/VISTARIL) 25 MG tablet Take 1 tablet (25 mg total) by mouth 3 (three) times daily as needed for anxiety. Patient not taking: Reported on 08/12/2016 02/04/14   Rikki Spearing P, DO  sertraline (ZOLOFT) 50 MG tablet Take 1/2 tab PO nightly for 2 days  then 1 tab po at night daily Patient not taking: Reported on 08/12/2016 02/04/14   Rikki Spearing P, DO    Allergies    Patient has no known allergies.  Review of Systems   Review of Systems All other systems negative except as documented in the HPI. All pertinent positives and negatives as reviewed in the HPI. Physical Exam Updated Vital Signs BP 130/74 (BP Location: Right Arm)   Pulse 71   Temp 98.3 F (36.8 C) (Oral)   Resp 18   Ht 5\' 9"  (1.753 m)   Wt 80 kg   SpO2 100%   BMI 26.05 kg/m   Physical Exam Vitals and nursing note reviewed.  Constitutional:      General: He is not in acute distress.    Appearance: He is well-developed.  HENT:     Head: Normocephalic and atraumatic.  Eyes:     Pupils: Pupils are equal, round, and reactive to light.  Cardiovascular:     Rate and Rhythm: Normal rate and regular rhythm.     Heart sounds: Normal heart sounds. No murmur. No friction rub. No gallop.   Pulmonary:     Effort: Pulmonary effort is normal. No respiratory distress.     Breath sounds: Examination of the right-upper field reveals wheezing. Examination of the left-upper field reveals wheezing. Examination of the right-middle field reveals wheezing. Examination of the left-middle field reveals wheezing. Examination  of the right-lower field reveals wheezing. Examination of the left-lower field reveals wheezing. Wheezing present. No decreased breath sounds, rhonchi or rales.  Abdominal:     General: Bowel sounds are normal. There is no distension.     Palpations: Abdomen is soft.     Tenderness: There is no abdominal tenderness.  Musculoskeletal:     Cervical back: Normal range of motion and neck supple.  Skin:    General: Skin is warm and dry.     Capillary Refill: Capillary refill takes less than 2 seconds.     Findings: No erythema or rash.  Neurological:     Mental Status: He is alert and oriented to person, place, and time.     Motor: No abnormal muscle tone.      Coordination: Coordination normal.  Psychiatric:        Behavior: Behavior normal.     ED Results / Procedures / Treatments   Labs (all labs ordered are listed, but only abnormal results are displayed) Labs Reviewed  RESPIRATORY PANEL BY RT PCR (FLU A&B, COVID)    EKG None  Radiology DG Chest 2 View  Result Date: 03/08/2019 CLINICAL DATA:  Acute shortness of breath for 4 days. EXAM: CHEST - 2 VIEW COMPARISON:  10/30/2013 FINDINGS: The cardiomediastinal silhouette is unremarkable. There is no evidence of focal airspace disease, pulmonary edema, suspicious pulmonary nodule/mass, pleural effusion, or pneumothorax. No acute bony abnormalities are identified. IMPRESSION: No active cardiopulmonary disease. Electronically Signed   By: Harmon Pier M.D.   On: 03/08/2019 10:11    Procedures Procedures (including critical care time)  Medications Ordered in ED Medications  albuterol (PROVENTIL,VENTOLIN) solution continuous neb (10 mg/hr Nebulization Given 03/08/19 1141)  predniSONE (DELTASONE) tablet 60 mg (60 mg Oral Given 03/08/19 1253)    ED Course  I have reviewed the triage vital signs and the nursing notes.  Pertinent labs & imaging results that were available during my care of the patient were reviewed by me and considered in my medical decision making (see chart for details).    MDM Rules/Calculators/A&P                      Patient was given an hour-long neb treatment and steroids.  Patient is feeling vastly improved.  His wheezing has improved as well.  On examination he has a sporadic expiratory wheeze in the right lower lung field.  Patient be discharged home on albuterol and steroids.  I feel that this is a bronchitis related to his heavy smoking.  Patient does not have a fever or other signs of infectious etiology at this point. Final Clinical Impression(s) / ED Diagnoses Final diagnoses:  None    Rx / DC Orders ED Discharge Orders    None       Charlestine Night, PA-C 03/08/19 1408    Little, Ambrose Finland, MD 03/08/19 1432

## 2019-03-08 NOTE — ED Notes (Signed)
Patient ambulated and SpO2 stayed at or above 95%

## 2019-03-08 NOTE — ED Triage Notes (Addendum)
Pt was seen at [facility] 2/24 given an inhaler for [facility], finished inhaler and feels SHOB. Requesting help of Heroin abuse, last use yesterday, pt tearful in triage. He then stated he has a headache and thoughts of harming himself without a plan.

## 2019-03-08 NOTE — Discharge Instructions (Addendum)
Return here as needed.  If your condition worsens you will need to return here for recheck.  Increase your fluid intake and rest as much as possible.

## 2019-03-19 MED ORDER — CLONIDINE HCL 0.1 MG PO TABS
0.10 | ORAL_TABLET | ORAL | Status: DC
Start: ? — End: 2019-03-19

## 2019-03-19 MED ORDER — DIPHENHYDRAMINE HCL 25 MG PO CAPS
25.00 | ORAL_CAPSULE | ORAL | Status: DC
Start: 2019-03-19 — End: 2019-03-19

## 2019-03-19 MED ORDER — INFLUENZA VAC SPLIT QUAD 0.5 ML IM SUSY
0.50 | PREFILLED_SYRINGE | INTRAMUSCULAR | Status: DC
Start: ? — End: 2019-03-19

## 2019-03-19 MED ORDER — PAROXETINE HCL 10 MG PO TABS
10.00 | ORAL_TABLET | ORAL | Status: DC
Start: 2019-03-20 — End: 2019-03-19

## 2019-04-17 ENCOUNTER — Other Ambulatory Visit: Payer: Self-pay

## 2019-04-17 ENCOUNTER — Ambulatory Visit: Payer: Self-pay | Admitting: Internal Medicine

## 2019-04-17 ENCOUNTER — Encounter: Payer: Self-pay | Admitting: Internal Medicine

## 2019-04-17 VITALS — BP 122/70 | HR 62 | Resp 12 | Ht 68.25 in | Wt 157.0 lb

## 2019-04-17 DIAGNOSIS — F411 Generalized anxiety disorder: Secondary | ICD-10-CM

## 2019-04-17 DIAGNOSIS — F419 Anxiety disorder, unspecified: Secondary | ICD-10-CM

## 2019-04-17 DIAGNOSIS — F410 Panic disorder [episodic paroxysmal anxiety] without agoraphobia: Secondary | ICD-10-CM

## 2019-04-17 DIAGNOSIS — F1910 Other psychoactive substance abuse, uncomplicated: Secondary | ICD-10-CM

## 2019-04-17 DIAGNOSIS — F3341 Major depressive disorder, recurrent, in partial remission: Secondary | ICD-10-CM

## 2019-04-17 DIAGNOSIS — F32A Depression, unspecified: Secondary | ICD-10-CM | POA: Insufficient documentation

## 2019-04-17 DIAGNOSIS — F329 Major depressive disorder, single episode, unspecified: Secondary | ICD-10-CM | POA: Insufficient documentation

## 2019-04-17 MED ORDER — PAROXETINE HCL 20 MG PO TABS: ORAL_TABLET | ORAL | 4 refills | Status: DC

## 2019-04-17 MED ORDER — HYDROXYZINE HCL 25 MG PO TABS: 25.0000 mg | ORAL_TABLET | Freq: Three times a day (TID) | ORAL | 4 refills | Status: DC | PRN

## 2019-04-17 NOTE — Progress Notes (Signed)
Subjective:    Patient ID: Aaron Blankenship, male   DOB: 1990-06-28, 28 y.o.   MRN: [id number]   HPI   Here to establish  1.  Heroin/narcotic pain med addiction:  Realized a problem in December when he spent all his money on it.   States first started use 2 years ago.   Working with a Buprenorphine clinic somewhere on [address] and [address].  He goes there every day for his Buprenorphine 20 mg sublingually  Almost overdosed at one time.   Has Narcan.  2.  Depression and anxiety:  Taking Paroxetine 10 mg daily.  Diagnosed at age 74 yo. Came to [place] age 42 yo.   Father was in and out of the household with Holiday representative job.  Did not have a good relationship with him. Mother, 1 brother and 1 sister lived together.  He is the oldest child. Everyone other than dad was in [place] until his mother found out father was having an affair in [place] and moved everyone up to [place]  History of drinking a lot of alcohol starting age 38 yo.  Was a lot of use in his circle of friends. History of MJ since 19 and cocaine in past as well Stopped drinking on his own about  2018--just tired of it.   Was also abusing Xanax earlier this year.   Went through [facility] in [place]. Has always worried a lot.  Worried his mother would leave and not come back.  Describes panic attack even back at age 64 yo. Has felt suicidal in the past--describes considering as he was stuck in his room, afraid to leave.  Was treated with unknown medication and just made him so sleepy. May have had episodes in past where has a lot of energy and did not need sleep--but sounds more like when he was using significantly.  Feels well on Paroxetine 10 mg daily and Hydroxyzine 25 mg three times daily.  Needs these prescribed.      Current Meds  Medication Sig  . buprenorphine (SUBUTEX) 8 MG SUBL SL tablet Place 16 mg under the tongue. 2 daily  . hydrOXYzine (ATARAX/VISTARIL) 25 MG tablet Take 1 tablet (25 mg  total) by mouth 3 (three) times daily as needed for anxiety.  . naloxone (NARCAN) nasal spray 4 mg/0.1 mL Instill 1 spray into nostril for respiratory depression.  Marland Kitchen PARoxetine (PAXIL) 10 MG tablet Take by mouth daily.    No Known Allergies   Past Medical History:  Diagnosis Date  . Anxiety    with panic disorder  . Depression   . Heroin abuse (HCC)    No past surgical history on file.  Family History  Problem Relation Age of Onset  . Diabetes Mother   . Depression Mother   . Depression Sister     Social History   Socioeconomic History  . Marital status: Single    Spouse name: Not on file  . Number of children: 1  . Years of education: Not on file  . Highest education level: Not on file  Occupational History  . Not on file  Tobacco Use  . Smoking status: Former Smoker    Types: Cigarettes  . Smokeless tobacco: Never Used  Substance and Sexual Activity  . Alcohol use: Not Currently    Alcohol/week: 0.0 standard drinks    Comment: Abused in past--nothing since 2018  . Drug use: Yes    Types: Marijuana, Cocaine, Heroin, Benzodiazepines  Comment: Heroin  . Sexual activity: Not Currently  Other Topics Concern  . Not on file  Social History Narrative   Lives with his mom currently   Social Determinants of Health   Financial Resource Strain:   . Difficulty of Paying Living Expenses:   Food Insecurity:   . Worried About Charity fundraiser in the Last Year:   . Arboriculturist in the Last Year:   Transportation Needs:   . Film/video editor (Medical):   Marland Kitchen Lack of Transportation (Non-Medical):   Physical Activity:   . Days of Exercise per Week:   . Minutes of Exercise per Session:   Stress:   . Feeling of Stress :   Social Connections:   . Frequency of Communication with Friends and Family:   . Frequency of Social Gatherings with Friends and Family:   . Attends Religious Services:   . Active Member of Clubs or Organizations:   . Attends Theatre manager Meetings:   Marland Kitchen Marital Status:   Intimate Partner Violence:   . Fear of Current or Ex-Partner:   . Emotionally Abused:   Marland Kitchen Physically Abused:   . Sexually Abused:       Review of Systems    Objective:   BP 122/70 (BP Location: Left Arm, Patient Position: Sitting, Cuff Size: Normal)   Pulse 62   Resp 12   Ht 5' 8.25" (1.734 m)   Wt 157 lb (71.2 kg)   BMI 23.70 kg/m   Physical Exam  HEENT:  PERRL, EOMI,Conjunctivae injected bilaterally without discharge. Neck:  Supple, No adenopathy Chest:  CTA CV:  RRR with normal S1 and S2, No S3, S4 or murmur.  Radial and DP pulses normal and equal. Abd:  S, NT, No HSM or mass. + BS LE:  No edema.   Assessment & Plan   1.  Depression/panic disorder/generalized anxiety:  Has a counselor through [facility] clinic. Hydroxyzine 25 mg 3 times daily--refills for 4 months Paroxetine 20 mg 1/2 tab daily--refills for 4 months. Follow up in 2 months.  2.  Polysubstance abuse throughout life, Heroin current drug of choice.  Continue with Subutex.   Release for labs done at [facility] in [facility], including HIV, Hep C and B if possible. Encouraged him to reconsider doing group therapy/Intensive outpatient therapy.

## 2019-04-23 NOTE — Clinical Social Work Note (Signed)
Routine New Patient Screeners  Social worker met with patient to complete routine new patient screeners, SDOH, PHQ-9, and GAD-7. Patient reported having a fair amount of stress due to bills and his ex not allowing him to see his daughter. Patient states the situation with his daughter is now resolved. Patient reports he is also seeing a Social worker.  Patient scored a 5 on the PHQ-9. Patient reports that he has trouble falling and staying asleep, and feels bad about himself due to his prior substance use, which he is now receiving treatment for.  Patient scored a 2 on the GAD-7. Patient states that he worries about bills, but that he has a job now, and financial strain has since reduced.    Jeraldine Loots, MSW Student Intern

## 2019-05-13 ENCOUNTER — Telehealth: Payer: Self-pay | Admitting: Internal Medicine

## 2019-05-13 NOTE — Telephone Encounter (Signed)
I've been calling patient multiple times including today with no answer and voicemail has not been set up yet. We sent authorization to release medical records to [facility] on 04/17/2019;  they sent back their own form that needs to be filled and signed by patient on 04/18/2019. Trying to get in touch with patient to inform that needs to stop at the office to sign this form.

## 2019-06-17 ENCOUNTER — Ambulatory Visit: Payer: Self-pay | Admitting: Internal Medicine

## 2019-07-18 ENCOUNTER — Other Ambulatory Visit: Payer: Self-pay

## 2019-07-18 ENCOUNTER — Emergency Department (HOSPITAL_COMMUNITY): Payer: Self-pay

## 2019-07-18 ENCOUNTER — Encounter (HOSPITAL_COMMUNITY): Payer: Self-pay

## 2019-07-18 ENCOUNTER — Emergency Department (HOSPITAL_COMMUNITY)
Admission: EM | Admit: 2019-07-18 | Discharge: 2019-07-18 | Disposition: A | Discharge status: Home / Self Care | Payer: Self-pay | Attending: Emergency Medicine | Admitting: Emergency Medicine

## 2019-07-18 ENCOUNTER — Emergency Department (HOSPITAL_COMMUNITY)
Admission: EM | Admit: 2019-07-18 | Discharge: 2019-07-19 | Disposition: A | Discharge status: Home / Self Care | Payer: Self-pay | Attending: Emergency Medicine | Admitting: Emergency Medicine

## 2019-07-18 DIAGNOSIS — Z87891 Personal history of nicotine dependence: Secondary | ICD-10-CM | POA: Insufficient documentation

## 2019-07-18 DIAGNOSIS — R000 Tachycardia, unspecified: Secondary | ICD-10-CM | POA: Insufficient documentation

## 2019-07-18 DIAGNOSIS — E0590 Thyrotoxicosis, unspecified without thyrotoxic crisis or storm: Secondary | ICD-10-CM | POA: Insufficient documentation

## 2019-07-18 DIAGNOSIS — J40 Bronchitis, not specified as acute or chronic: Secondary | ICD-10-CM | POA: Insufficient documentation

## 2019-07-18 DIAGNOSIS — Z20822 Contact with and (suspected) exposure to covid-19: Secondary | ICD-10-CM | POA: Insufficient documentation

## 2019-07-18 DIAGNOSIS — Z5321 Procedure and treatment not carried out due to patient leaving prior to being seen by health care provider: Secondary | ICD-10-CM | POA: Insufficient documentation

## 2019-07-18 DIAGNOSIS — F1113 Opioid abuse with withdrawal: Secondary | ICD-10-CM | POA: Insufficient documentation

## 2019-07-18 DIAGNOSIS — F1910 Other psychoactive substance abuse, uncomplicated: Secondary | ICD-10-CM | POA: Insufficient documentation

## 2019-07-18 LAB — CBC WITH DIFFERENTIAL/PLATELET
Abs Immature Granulocytes: 0.03 10*3/uL (ref 0.00–0.07)
Basophils Absolute: 0 10*3/uL (ref 0.0–0.1)
Basophils Relative: 0 %
Eosinophils Absolute: 0 10*3/uL (ref 0.0–0.5)
Eosinophils Relative: 0 %
HCT: 41.3 % (ref 39.0–52.0)
Hemoglobin: 14.2 g/dL (ref 13.0–17.0)
Immature Granulocytes: 0 %
Lymphocytes Relative: 4 %
Lymphs Abs: 0.4 10*3/uL — ABNORMAL LOW (ref 0.7–4.0)
MCH: 28.9 pg (ref 26.0–34.0)
MCHC: 34.4 g/dL (ref 30.0–36.0)
MCV: 83.9 fL (ref 80.0–100.0)
Monocytes Absolute: 0.1 10*3/uL (ref 0.1–1.0)
Monocytes Relative: 1 %
Neutro Abs: 11.1 10*3/uL — ABNORMAL HIGH (ref 1.7–7.7)
Neutrophils Relative %: 95 %
Platelets: 301 10*3/uL (ref 150–400)
RBC: 4.92 MIL/uL (ref 4.22–5.81)
RDW: 13.3 % (ref 11.5–15.5)
WBC: 11.6 10*3/uL — ABNORMAL HIGH (ref 4.0–10.5)
nRBC: 0 % (ref 0.0–0.2)

## 2019-07-18 LAB — CBC
HCT: 46 % (ref 39.0–52.0)
Hemoglobin: 15.1 g/dL (ref 13.0–17.0)
MCH: 28.6 pg (ref 26.0–34.0)
MCHC: 32.8 g/dL (ref 30.0–36.0)
MCV: 87.1 fL (ref 80.0–100.0)
Platelets: 284 10*3/uL (ref 150–400)
RBC: 5.28 MIL/uL (ref 4.22–5.81)
RDW: 13.2 % (ref 11.5–15.5)
WBC: 8.7 10*3/uL (ref 4.0–10.5)
nRBC: 0 % (ref 0.0–0.2)

## 2019-07-18 LAB — BASIC METABOLIC PANEL
Anion gap: 15 (ref 5–15)
BUN: 13 mg/dL (ref 6–20)
CO2: 21 mmol/L — ABNORMAL LOW (ref 22–32)
Calcium: 10 mg/dL (ref 8.9–10.3)
Chloride: 101 mmol/L (ref 98–111)
Creatinine, Ser: 1.04 mg/dL (ref 0.61–1.24)
GFR calc Af Amer: >60 mL/min (ref >60)
GFR calc non Af Amer: >60 mL/min (ref >60)
Glucose, Bld: 151 mg/dL — ABNORMAL HIGH (ref 70–99)
Potassium: 3.6 mmol/L (ref 3.5–5.1)
Sodium: 137 mmol/L (ref 135–145)

## 2019-07-18 LAB — COMPREHENSIVE METABOLIC PANEL
ALT: 24 U/L (ref 0–44)
AST: 21 U/L (ref 15–41)
Albumin: 4.2 g/dL (ref 3.5–5.0)
Alkaline Phosphatase: 81 U/L (ref 38–126)
Anion gap: 11 (ref 5–15)
BUN: 10 mg/dL (ref 6–20)
CO2: 27 mmol/L (ref 22–32)
Calcium: 9.3 mg/dL (ref 8.9–10.3)
Chloride: 101 mmol/L (ref 98–111)
Creatinine, Ser: 1.07 mg/dL (ref 0.61–1.24)
GFR calc Af Amer: >60 mL/min (ref >60)
GFR calc non Af Amer: >60 mL/min (ref >60)
Glucose, Bld: 136 mg/dL — ABNORMAL HIGH (ref 70–99)
Potassium: 3.9 mmol/L (ref 3.5–5.1)
Sodium: 139 mmol/L (ref 135–145)
Total Bilirubin: 0.9 mg/dL (ref 0.3–1.2)
Total Protein: 7.3 g/dL (ref 6.5–8.1)

## 2019-07-18 LAB — SALICYLATE LEVEL: Salicylate Lvl: <7 mg/dL — ABNORMAL LOW (ref 7.0–30.0)

## 2019-07-18 LAB — ETHANOL: Alcohol, Ethyl (B): <10 mg/dL (ref <10)

## 2019-07-18 LAB — RAPID URINE DRUG SCREEN, HOSP PERFORMED
Amphetamines: NOT DETECTED
Barbiturates: NOT DETECTED
Benzodiazepines: NOT DETECTED
Cocaine: NOT DETECTED
Opiates: POSITIVE — AB
Tetrahydrocannabinol: POSITIVE — AB

## 2019-07-18 LAB — TSH: TSH: 0.153 u[IU]/mL — ABNORMAL LOW (ref 0.350–4.500)

## 2019-07-18 LAB — SARS CORONAVIRUS 2 BY RT PCR (HOSPITAL ORDER, PERFORMED IN ~~LOC~~ HOSPITAL LAB): SARS Coronavirus 2: NEGATIVE

## 2019-07-18 LAB — ACETAMINOPHEN LEVEL: Acetaminophen (Tylenol), Serum: <10 ug/mL — ABNORMAL LOW (ref 10–30)

## 2019-07-18 LAB — D-DIMER, QUANTITATIVE: D-Dimer, Quant: <0.27 ug/mL-FEU (ref 0.00–0.50)

## 2019-07-18 MED ORDER — MORPHINE SULFATE (PF) 2 MG/ML IV SOLN: 2.0000 mg | Freq: Once | INTRAVENOUS | Status: DC

## 2019-07-18 MED ORDER — PREDNISONE 20 MG PO TABS
60.0000 mg | ORAL_TABLET | Freq: Once | ORAL | Status: AC
  Administered 2019-07-18: 60 mg via ORAL
  Filled 2019-07-18: qty 3

## 2019-07-18 MED ORDER — ALBUTEROL (5 MG/ML) CONTINUOUS INHALATION SOLN
10.0000 mg/h | INHALATION_SOLUTION | RESPIRATORY_TRACT | Status: DC
  Administered 2019-07-18: 10 mg/h via RESPIRATORY_TRACT
  Filled 2019-07-18: qty 20

## 2019-07-18 MED ORDER — LORAZEPAM 2 MG/ML IJ SOLN
1.0000 mg | Freq: Once | INTRAMUSCULAR | Status: AC
  Administered 2019-07-18: 1 mg via INTRAVENOUS
  Filled 2019-07-18: qty 1

## 2019-07-18 MED ORDER — ONDANSETRON HCL 4 MG/2ML IJ SOLN
4.0000 mg | Freq: Once | INTRAMUSCULAR | Status: AC
  Administered 2019-07-18: 4 mg via INTRAVENOUS
  Filled 2019-07-18: qty 2

## 2019-07-18 MED ORDER — IPRATROPIUM BROMIDE 0.02 % IN SOLN
0.5000 mg | Freq: Once | RESPIRATORY_TRACT | Status: AC
  Administered 2019-07-18: 0.5 mg via RESPIRATORY_TRACT
  Filled 2019-07-18: qty 2.5

## 2019-07-18 MED ORDER — MAGNESIUM SULFATE 2 GM/50ML IV SOLN
2.0000 g | Freq: Once | INTRAVENOUS | Status: AC
  Administered 2019-07-18: 2 g via INTRAVENOUS
  Filled 2019-07-18: qty 50

## 2019-07-18 MED ORDER — ALBUTEROL SULFATE HFA 108 (90 BASE) MCG/ACT IN AERS
4.0000 | INHALATION_SPRAY | Freq: Once | RESPIRATORY_TRACT | Status: AC
  Administered 2019-07-18: 4 via RESPIRATORY_TRACT
  Filled 2019-07-18: qty 6.7

## 2019-07-18 MED ORDER — AEROCHAMBER PLUS FLO-VU LARGE MISC
1.0000 | Freq: Once | Status: AC
  Administered 2019-07-18: 1

## 2019-07-18 MED ORDER — SODIUM CHLORIDE 0.9 % IV BOLUS
1000.0000 mL | Freq: Once | INTRAVENOUS | Status: AC
  Administered 2019-07-19: 1000 mL via INTRAVENOUS

## 2019-07-18 MED ORDER — PREDNISONE 10 MG (21) PO TBPK
ORAL_TABLET | Freq: Every day | ORAL | 0 refills | Status: DC
Start: 2019-07-18 — End: 2020-03-29

## 2019-07-18 MED ORDER — METHYLPREDNISOLONE SODIUM SUCC 125 MG IJ SOLR
125.0000 mg | Freq: Once | INTRAMUSCULAR | Status: AC
  Administered 2019-07-18: 125 mg via INTRAVENOUS
  Filled 2019-07-18: qty 2

## 2019-07-18 MED ORDER — ALBUTEROL SULFATE HFA 108 (90 BASE) MCG/ACT IN AERS
8.0000 | INHALATION_SPRAY | Freq: Once | RESPIRATORY_TRACT | Status: AC
  Administered 2019-07-18: 8 via RESPIRATORY_TRACT
  Filled 2019-07-18: qty 6.7

## 2019-07-18 MED ORDER — SODIUM CHLORIDE 0.9 % IV BOLUS
1000.0000 mL | Freq: Once | INTRAVENOUS | Status: AC
  Administered 2019-07-18: 1000 mL via INTRAVENOUS

## 2019-07-18 MED ORDER — LABETALOL HCL 5 MG/ML IV SOLN
5.0000 mg | Freq: Once | INTRAVENOUS | Status: AC
  Administered 2019-07-19: 5 mg via INTRAVENOUS
  Filled 2019-07-18: qty 4

## 2019-07-18 MED ORDER — DOXYCYCLINE HYCLATE 100 MG PO CAPS
100.0000 mg | ORAL_CAPSULE | Freq: Two times a day (BID) | ORAL | 0 refills | Status: DC
Start: 2019-07-18 — End: 2020-09-24

## 2019-07-18 NOTE — ED Provider Notes (Signed)
[facility]-EMERGENCY DEPT Provider Note   CSN: [id number] Arrival date & time: 07/18/19  2033   History Chief Complaint  Patient presents with   Withdrawal   Aaron Blankenship is a 30 y.o. male with past medical history significant for polysubstance abuse, anxiety who presents for evaluation of requesting detox.  Patient denies any SI, HI, AVH.  States he was seen earlier today for shortness of breath and abdominal pain which has resolved. Patient states he would like detox from heroin.  Patient denies any IV drug use.  States he does occasionally use alcohol however cannot member the last time he used alcohol.  He does admit to using heroin, snorting yesterday evening.  Patient states he feels like he is withdrawing.  He is requesting assistance with this.  States he typically uses illicit substances to combat with his anxiety.  States substances have "ruined my life."  He denies any thoughts of harming himself or others.  Denies additional aggravating or alleviating factors.  No fever, chills, nausea, vomiting, chest pain, shortness of breath abdominal pain, dysuria, rashes or lesions.  Patient did state that he has had 2 episodes of diarrhea earlier today which was without melena or bright red blood per rectum.  No chronic NSAID use or alcohol use.  History obtained from patient and past medical records.  No interpreter used.  HPI     Past Medical History:  Diagnosis Date   Anxiety    with panic disorder   Depression    Heroin abuse [facility])     Patient Active Problem List   Diagnosis Date Noted   Polysubstance abuse (HCC) 04/17/2019   Anxiety    Depression    ALLERGIC RHINITIS 09/07/2008   UVEITIS 10/05/2005    History reviewed. No pertinent surgical history.     Family History  Problem Relation Age of Onset   Diabetes Mother    Depression Mother    Depression Sister     Social History   Tobacco Use   Smoking status: Former Smoker     Types: Cigarettes   Smokeless tobacco: Never Used  Building services engineer Use: Former  Substance Use Topics   Alcohol use: Not Currently    Alcohol/week: 0.0 standard drinks    Comment: Abused in past--nothing since 2018   Drug use: Yes    Types: Marijuana, Cocaine, Heroin, Benzodiazepines    Comment: Heroin    Home Medications Prior to Admission medications   Medication Sig Start Date End Date Taking? Authorizing Provider  doxycycline (VIBRAMYCIN) 100 MG capsule Take 1 capsule (100 mg total) by mouth 2 (two) times daily. 07/18/19   Liberty Handy, PA-C  hydrOXYzine (ATARAX/VISTARIL) 25 MG tablet Take 1 tablet (25 mg total) by mouth 3 (three) times daily as needed for anxiety. 04/17/19   Julieanne Manson, MD  predniSONE (STERAPRED UNI-PAK 21 TAB) 10 MG (21) TBPK tablet Take by mouth daily. Take 6 tabs by mouth daily  for 2 days, then 5 tabs for 2 days, then 4 tabs for 2 days, then 3 tabs for 2 days, 2 tabs for 2 days, then 1 tab by mouth daily for 2 days 07/18/19   Liberty Handy, PA-C    Allergies    Patient has no known allergies.  Review of Systems   Review of Systems  Constitutional: Negative.   HENT: Negative.   Respiratory: Negative.   Cardiovascular: Negative.   Gastrointestinal: Positive for diarrhea. Negative for abdominal distention, abdominal pain,  anal bleeding, blood in stool, constipation, nausea, rectal pain and vomiting.  Genitourinary: Negative.   Musculoskeletal: Negative.   Skin: Negative.   Neurological: Negative.   All other systems reviewed and are negative.   Physical Exam Updated Vital Signs BP (!) 101/54 (BP Location: Right Arm)    Pulse (!) 113    Temp 98.7 F (37.1 C) (Oral)    Resp 20    SpO2 95%   Physical Exam Vitals and nursing note reviewed.  Constitutional:      General: He is not in acute distress.    Appearance: He is well-developed. He is not ill-appearing, toxic-appearing or diaphoretic.  HENT:     Head: Normocephalic and  atraumatic.     Nose: Nose normal.     Mouth/Throat:     Mouth: Mucous membranes are moist.     Pharynx: Oropharynx is clear.  Eyes:     Pupils: Pupils are equal, round, and reactive to light.  Cardiovascular:     Rate and Rhythm: Regular rhythm. Tachycardia present.     Pulses: Normal pulses.     Heart sounds: Normal heart sounds.  Pulmonary:     Effort: Pulmonary effort is normal. No respiratory distress.     Breath sounds: Normal breath sounds.     Comments: Very faint expiratory wheeze however speaks in full sentences without difficulty Abdominal:     General: Bowel sounds are normal. There is no distension.     Palpations: Abdomen is soft.     Tenderness: There is no abdominal tenderness. There is no right CVA tenderness, left CVA tenderness or guarding.     Comments: Soft, nontender without rebound or guarding  Musculoskeletal:        General: Normal range of motion.     Cervical back: Normal range of motion and neck supple.     Comments: Moves all 4 extremities without difficulty.  Compartments soft.  Skin:    General: Skin is warm and dry.     Capillary Refill: Capillary refill takes less than 2 seconds.     Comments: Brisk capillary refill no rash  Neurological:     Mental Status: He is alert.  Psychiatric:     Comments: Denies SI, HI, AVH however does admit to anxiety.    ED Results / Procedures / Treatments   Labs (all labs ordered are listed, but only abnormal results are displayed) Labs Reviewed  CBC WITH DIFFERENTIAL/PLATELET - Abnormal; Notable for the following components:      Result Value   WBC 11.6 (*)    Neutro Abs 11.1 (*)    Lymphs Abs 0.4 (*)    All other components within normal limits  BASIC METABOLIC PANEL - Abnormal; Notable for the following components:   CO2 21 (*)    Glucose, Bld 151 (*)    All other components within normal limits  ACETAMINOPHEN LEVEL - Abnormal; Notable for the following components:   Acetaminophen (Tylenol), Serum <10  (*)    All other components within normal limits  SALICYLATE LEVEL - Abnormal; Notable for the following components:   Salicylate Lvl <7.0 (*)    All other components within normal limits  RAPID URINE DRUG SCREEN, HOSP PERFORMED - Abnormal; Notable for the following components:   Opiates POSITIVE (*)    Tetrahydrocannabinol POSITIVE (*)    All other components within normal limits  TSH - Abnormal; Notable for the following components:   TSH 0.153 (*)    All other components within  normal limits  ETHANOL  D-DIMER, QUANTITATIVE (NOT AT [facility])  T3  T4, FREE    EKG None  Radiology DG Chest 2 View  Result Date: 07/18/2019 CLINICAL DATA:  Shortness of breath, cough EXAM: CHEST - 2 VIEW COMPARISON:  03/08/2019 FINDINGS: The heart size and mediastinal contours are within normal limits. Both lungs are clear. The visualized skeletal structures are unremarkable. IMPRESSION: No active cardiopulmonary disease. Electronically Signed   By: Judie Petit.  Shick M.D.   On: 07/18/2019 08:07   Procedures Procedures (including critical care time)  Medications Ordered in ED Medications  labetalol (NORMODYNE) injection 5 mg (has no administration in time range)  sodium chloride 0.9 % bolus 1,000 mL (has no administration in time range)  ondansetron (ZOFRAN) injection 4 mg (4 mg Intravenous Given 07/18/19 2212)  sodium chloride 0.9 % bolus 1,000 mL (0 mLs Intravenous Stopped 07/18/19 2301)  LORazepam (ATIVAN) injection 1 mg (1 mg Intravenous Given 07/18/19 2212)   ED Course  I have reviewed the triage vital signs and the nursing notes.  Pertinent labs & imaging results that were available during my care of the patient were reviewed by me and considered in my medical decision making (see chart for details).  29 year old male presents for evaluation of possible withdrawals and detox.  Was seen earlier today at [facility] for shortness of breath and abdominal pain does use daily tobacco.  Was previously on  buprenorphine however stopped this approximately 1 month ago.  He is afebrile, nonseptic, not ill-appearing.  He is not writhing around in bed stating he has myalgias.  Negative COVID-19 earlier today.  Denies any IV drug use.  Abdomen soft, nontender.  Does have diffuse expiratory wheeze however speaks in full sentences without difficulty. Significantly tachycardic on exam.  No murmur, low suspicion for endocarditis, sepsis.  Labs and imaging personally reviewed and interpreted:  CBC with leukocytosis at 11.6 Metabolic panel mild hyperglycemia to 151 Acetaminophen less than 10 salicylate less than 7, ethanol less than 10 UDS positive for opiates and THC Ddimer <0.27, low suspicion for PE.  Discussed patient with attending, Dr. Silverio Lay.  He recommends given patient seen earlier today for shortness of breath to obtain D-dimer, TSH, fluids, Ativan and reassess.  Patient tachycardia has improved however still tachycardic to 113.  His TSH is low, question symptomatic hyperthyroidism?  Will obtain T3, T4.  We will do some beta-blockers and additional fluids and plan to reassess.  Care transferred to [facility], PA-C who will follow up on remaining labs, EKG and reassessment.  Plan on DC if vital signs improved with follow-up outpatient with resources for outpatient substance abuse. If persistent tachycardia consider admission.     MDM Rules/Calculators/A&P                           Final Clinical Impression(s) / ED Diagnoses Final diagnoses:  Polysubstance abuse (HCC)  Tachycardia  Hyperthyroidism    Rx / DC Orders ED Discharge Orders    None       Aimie Wagman A, PA-C 07/19/19 0012    Charlynne Pander, MD 07/21/19 1505

## 2019-07-18 NOTE — ED Triage Notes (Signed)
Pt back to the ED requesting something to help with his nerves and to help detox off herion , last use was yesterday, pt was in thE ED this morning for SOB

## 2019-07-18 NOTE — Progress Notes (Signed)
Peak flow attempts x3: 110 130 150

## 2019-07-18 NOTE — Progress Notes (Signed)
Post CAT peak flow results:  190 180 210

## 2019-07-18 NOTE — ED Notes (Signed)
RT paged and made aware of pt new orders.
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
## 2019-07-18 NOTE — Discharge Instructions (Signed)
You were seen in the ED for cough, shortness of breath  Covid test was negative.  Chest x-ray was negative.  Labs were normal.  I think your symptoms are from a lower respiratory infection or possibly bronchitis  This is all worsened by smoking cigarettes and marijuana, consider cutting back on these  Use your albuterol inhaler 2 puffs every 4-6 hours.  Finish prednisone taper.  Take doxycycline in the morning and at night.  Return to the ED for worsening symptoms, fever greater than 100.4, worsening shortness of breath, chest pain

## 2019-07-18 NOTE — ED Notes (Signed)
Urine and blood sent to the lab at this time. No orders at this time so specimen to be held

## 2019-07-18 NOTE — ED Triage Notes (Signed)
Pt here with c/o sob oer the last 2 days , pt has asthma and has been using his inhaler without relief , pt does smoke cigarettes and weed , lasted smoked 2 days ago

## 2019-07-18 NOTE — ED Notes (Signed)
   Patient Saturations on Room Air at Rest = 97%  Patient Saturations on Room Air while Ambulating = 91%     

## 2019-07-18 NOTE — ED Triage Notes (Signed)
Pt sts withdrawals from heroin. Sts last use was last night but just a tiny amount.

## 2019-07-18 NOTE — ED Notes (Signed)
AVS reviewed with patient who verbalized understanding. Pt ambulatory out of department.

## 2019-07-19 MED ORDER — HYDROXYZINE HCL 25 MG PO TABS
25.0000 mg | ORAL_TABLET | Freq: Four times a day (QID) | ORAL | 0 refills | Status: DC | PRN
Start: 2019-07-19 — End: 2020-09-24

## 2019-07-19 MED ORDER — DICYCLOMINE HCL 20 MG PO TABS
20.0000 mg | ORAL_TABLET | Freq: Three times a day (TID) | ORAL | 0 refills | Status: DC | PRN
Start: 2019-07-19 — End: 2020-09-24

## 2019-07-19 NOTE — Discharge Instructions (Signed)
You were seen in the emergency department today for withdrawal symptoms. Your work-up was overall reassuring, however your thyroid stimulating hormone test was low, we are concerned that your thyroid function is abnormal, would like you to follow-up very closely with your primary care provider within 48 hours for this. We have sent further labs to evaluate this more. We are sending you home with Atarax to take every 6 hours as needed for anxiety and Bentyl to take every 8 hours as needed for abdominal cramping. Please take the previously prescribed antibiotic and steroid from your visit this morning.  We have prescribed you new medication(s) today. Discuss the medications prescribed today with your pharmacist as they can have adverse effects and interactions with your other medicines including over the counter and prescribed medications. Seek medical evaluation if you start to experience new or abnormal symptoms after taking one of these medicines, seek care immediately if you start to experience difficulty breathing, feeling of your throat closing, facial swelling, or rash as these could be indications of a more serious allergic reaction  We have provided resources in your discharge instructions to help with your drug use. We have also consulted peer support who will contact you. Please follow-up with your primary care discussed above. Return to the ER for new or worsening symptoms or any other concerns.  English to Spanish Google Translate:  Hoy lo vieron en el departamento de emergencias por sntomas de abstinencia. Su evaluacin fue en general tranquilizadora, sin embargo, su prueba de hormona estimulante de la tiroides fue baja, nos preocupa que su funcin tiroidea sea anormal, nos gustara que realizara un seguimiento muy de cerca con su proveedor de atencin primaria dentro de las 48 horas para esto. Hemos enviado ms laboratorios para evaluar esto ms. Lo enviamos a casa con Atarax para tomar cada 6  horas segn sea necesario para la ansiedad y Bentyl para tomar cada 8 horas segn sea necesario para los calambres abdominales. Por favor, tome el antibitico y esteroide previamente recetado de su visita de esta maana.  Le hemos recetado nuevos medicamentos hoy. Discuta los medicamentos recetados hoy con su farmacutico, ya que pueden tener efectos adversos e interacciones con sus otros medicamentos, incluidos los medicamentos recetados y de [place]. Busque una evaluacin mdica si comienza a experimentar sntomas nuevos o anormales despus de tomar uno de estos medicamentos, busque atencin mdica de inmediato si comienza a experimentar dificultad para respirar, sensacin de cierre de la garganta, hinchazn facial o sarpullido, ya que estos podran ser indicios de un problema ms grave reaccin alrgica  Hemos proporcionado recursos en sus instrucciones de alta para ayudarlo con su uso de medicamentos. Tambin hemos consultado a compaeros de apoyo que se comunicarn con usted. Haga un seguimiento con su atencin primaria como se mencion anteriormente. Regrese a la sala de emergencias por sntomas nuevos o que empeoran o por cualquier otra inquietud.

## 2019-07-19 NOTE — ED Provider Notes (Signed)
23:50: Assumed care of patient from Henderly PA-C at change of shift pending re-eval & HR assessment.   Please see prior provider note for full H&P.  Briefly patient is a 29 year old male who presented to the ED with multiple complaints, he had some concern from withdrawal from heroin which he snorts, last use yesterday. Was experiencing some dyspnea & abdominal discomfort but this is resolved. He is requesting assistance with his substance abuse.    Physical Exam  BP (!) 101/54 (BP Location: Right Arm)   Pulse (!) 113   Temp 98.7 F (37.1 C) (Oral)   Resp 20   SpO2 95%   Physical Exam Vitals and nursing note reviewed.  Constitutional:      General: He is not in acute distress.    Appearance: He is well-developed.  HENT:     Head: Normocephalic and atraumatic.  Eyes:     General:        Right eye: No discharge.        Left eye: No discharge.     Conjunctiva/sclera: Conjunctivae normal.  Cardiovascular:     Rate and Rhythm: Normal rate.  Pulmonary:     Effort: Pulmonary effort is normal.  Neurological:     Mental Status: He is alert.     Comments: Clear speech.   Psychiatric:        Behavior: Behavior normal.        Thought Content: Thought content normal.     ED Course/Procedures     Results for orders placed or performed during the hospital encounter of 07/18/19  CBC with Differential  Result Value Ref Range   WBC 11.6 (H) 4.0 - 10.5 K/uL   RBC 4.92 4.22 - 5.81 MIL/uL   Hemoglobin 14.2 13.0 - 17.0 g/dL   HCT 35.4 39 - 52 %   MCV 83.9 80.0 - 100.0 fL   MCH 28.9 26.0 - 34.0 pg   MCHC 34.4 30.0 - 36.0 g/dL   RDW 56.2 56.3 - 89.3 %   Platelets 301 150 - 400 K/uL   nRBC 0.0 0.0 - 0.2 %   Neutrophils Relative % 95 %   Neutro Abs 11.1 (H) 1.7 - 7.7 K/uL   Lymphocytes Relative 4 %   Lymphs Abs 0.4 (L) 0.7 - 4.0 K/uL   Monocytes Relative 1 %   Monocytes Absolute 0.1 0 - 1 K/uL   Eosinophils Relative 0 %   Eosinophils Absolute 0.0 0 - 0 K/uL   Basophils Relative 0  %   Basophils Absolute 0.0 0 - 0 K/uL   Immature Granulocytes 0 %   Abs Immature Granulocytes 0.03 0.00 - 0.07 K/uL  Basic metabolic panel  Result Value Ref Range   Sodium 137 135 - 145 mmol/L   Potassium 3.6 3.5 - 5.1 mmol/L   Chloride 101 98 - 111 mmol/L   CO2 21 (L) 22 - 32 mmol/L   Glucose, Bld 151 (H) 70 - 99 mg/dL   BUN 13 6 - 20 mg/dL   Creatinine, Ser 7.34 0.61 - 1.24 mg/dL   Calcium 28.7 8.9 - 68.1 mg/dL   GFR calc non Af Amer >60 >60 mL/min   GFR calc Af Amer >60 >60 mL/min   Anion gap 15 5 - 15  Acetaminophen level  Result Value Ref Range   Acetaminophen (Tylenol), Serum <10 (L) 10 - 30 ug/mL  Salicylate level  Result Value Ref Range   Salicylate Lvl <7.0 (L) 7.0 - 30.0 mg/dL  Rapid  urine drug screen (hospital performed)  Result Value Ref Range   Opiates POSITIVE (A) NONE DETECTED   Cocaine NONE DETECTED NONE DETECTED   Benzodiazepines NONE DETECTED NONE DETECTED   Amphetamines NONE DETECTED NONE DETECTED   Tetrahydrocannabinol POSITIVE (A) NONE DETECTED   Barbiturates NONE DETECTED NONE DETECTED  Ethanol  Result Value Ref Range   Alcohol, Ethyl (B) <10 <10 mg/dL  D-dimer, quantitative (not at [facility])  Result Value Ref Range   D-Dimer, Quant <0.27 0.00 - 0.50 ug/mL-FEU  TSH  Result Value Ref Range   TSH 0.153 (L) 0.350 - 4.500 uIU/mL   DG Chest 2 View  Result Date: 07/18/2019 CLINICAL DATA:  Shortness of breath, cough EXAM: CHEST - 2 VIEW COMPARISON:  03/08/2019 FINDINGS: The heart size and mediastinal contours are within normal limits. Both lungs are clear. The visualized skeletal structures are unremarkable. IMPRESSION: No active cardiopulmonary disease. Electronically Signed   By: Judie Petit.  Shick M.D.   On: 07/18/2019 08:07     Procedures   EKG with sinus tachycardia  MDM   Patient tachycardic on arrival.  Labs notable for low TSH, T3/T4 pending and will not result during visit.  Otherwise fairly unremarkable.  D-dimer negative therefore low suspicion for  pulmonary embolism.  No significant electrolyte derangement.  No significant anemia.  Patient was given Ativan, Zofran, fluids, and some labetalol with improvement in his symptoms as well as his heart rate.  Blood pressure (!) 101/54, pulse 91, temperature 98.7 F (37.1 C), temperature source Oral, resp. rate 20, SpO2 93 %.  With heart rate improvement and patient feeling better will discharge home with supportive care for withdrawal per discussion with prior provider. No SI/HI/hallucinations.  We have consulted peer support for assistance and will provide resources to the patient. I discussed results, treatment plan, need for follow-up, and return precautions with the patient. Provided opportunity for questions, patient confirmed understanding and is in agreement with plan.         Desmond Lope 07/19/19 3818    Geoffery Lyons, MD 07/19/19 [phone]

## 2020-03-29 ENCOUNTER — Other Ambulatory Visit: Payer: Self-pay

## 2020-03-29 ENCOUNTER — Encounter (HOSPITAL_COMMUNITY): Payer: Self-pay

## 2020-03-29 ENCOUNTER — Ambulatory Visit (HOSPITAL_COMMUNITY)
Admission: EM | Admit: 2020-03-29 | Discharge: 2020-03-29 | Disposition: A | Discharge status: Home / Self Care | Payer: Self-pay | Attending: Student | Admitting: Student

## 2020-03-29 DIAGNOSIS — M940 Chondrocostal junction syndrome [Tietze]: Secondary | ICD-10-CM

## 2020-03-29 DIAGNOSIS — Z87891 Personal history of nicotine dependence: Secondary | ICD-10-CM

## 2020-03-29 DIAGNOSIS — J209 Acute bronchitis, unspecified: Secondary | ICD-10-CM

## 2020-03-29 MED ORDER — PREDNISONE 20 MG PO TABS: 40.0000 mg | ORAL_TABLET | Freq: Every day | ORAL | 0 refills | Status: AC

## 2020-03-29 MED ORDER — ALBUTEROL SULFATE HFA 108 (90 BASE) MCG/ACT IN AERS
1.0000 | INHALATION_SPRAY | Freq: Four times a day (QID) | RESPIRATORY_TRACT | Status: DC | PRN
  Administered 2020-03-29: 2 via RESPIRATORY_TRACT

## 2020-03-29 MED ORDER — ALBUTEROL SULFATE HFA 108 (90 BASE) MCG/ACT IN AERS
INHALATION_SPRAY | RESPIRATORY_TRACT | Status: AC
  Filled 2020-03-29: qty 6.7

## 2020-03-29 NOTE — ED Triage Notes (Signed)
Pt c/o cough, chest pain and SOB  X 4 days. Pt states he has been coughing for 3 weeks. He states he has been given an inhaler before. He reports the inhaler has not been working.
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
## 2020-03-29 NOTE — Discharge Instructions (Addendum)
-  Use the albuterol inhaler as needed for shortness of breath and cough, up to every 6 hours as needed. -Start the prednisone as directed.  Take 2 pills a day for 5 days.  Take both pills together in the morning, as this can give you energy. -Come back and see [place] if your symptoms get worse instead of better, like new fever/chills, shortness of breath despite treatment, chest pain at rest, etc.

## 2020-09-24 ENCOUNTER — Encounter (HOSPITAL_COMMUNITY): Payer: Self-pay | Admitting: Emergency Medicine

## 2020-09-24 ENCOUNTER — Ambulatory Visit (HOSPITAL_COMMUNITY)
Admission: EM | Admit: 2020-09-24 | Discharge: 2020-09-24 | Disposition: A | Discharge status: Home / Self Care | Payer: Self-pay | Attending: Emergency Medicine | Admitting: Emergency Medicine

## 2020-09-24 ENCOUNTER — Other Ambulatory Visit: Payer: Self-pay

## 2020-09-24 DIAGNOSIS — Z20822 Contact with and (suspected) exposure to covid-19: Secondary | ICD-10-CM | POA: Insufficient documentation

## 2020-09-24 DIAGNOSIS — R1012 Left upper quadrant pain: Secondary | ICD-10-CM | POA: Insufficient documentation

## 2020-09-24 DIAGNOSIS — J4541 Moderate persistent asthma with (acute) exacerbation: Secondary | ICD-10-CM | POA: Insufficient documentation

## 2020-09-24 DIAGNOSIS — R112 Nausea with vomiting, unspecified: Secondary | ICD-10-CM | POA: Insufficient documentation

## 2020-09-24 MED ORDER — DEXAMETHASONE SODIUM PHOSPHATE 10 MG/ML IJ SOLN
10.0000 mg | Freq: Once | INTRAMUSCULAR | Status: AC
  Administered 2020-09-24: 10 mg via INTRAMUSCULAR

## 2020-09-24 MED ORDER — ALBUTEROL SULFATE HFA 108 (90 BASE) MCG/ACT IN AERS
6.0000 | INHALATION_SPRAY | Freq: Once | RESPIRATORY_TRACT | Status: AC
  Administered 2020-09-24: 6 via RESPIRATORY_TRACT

## 2020-09-24 MED ORDER — ONDANSETRON HCL 4 MG/2ML IJ SOLN
8.0000 mg | Freq: Once | INTRAMUSCULAR | Status: AC
  Administered 2020-09-24: 8 mg via INTRAMUSCULAR

## 2020-09-24 MED ORDER — AEROCHAMBER PLUS FLO-VU LARGE MISC
Status: AC
  Filled 2020-09-24: qty 1

## 2020-09-24 MED ORDER — DEXAMETHASONE 4 MG PO TABS
ORAL_TABLET | ORAL | 0 refills | Status: DC
Start: 2020-09-24 — End: 2020-11-09

## 2020-09-24 MED ORDER — AEROCHAMBER PLUS FLO-VU MEDIUM MISC
1.0000 | Freq: Once | Status: AC
  Administered 2020-09-24: 1

## 2020-09-24 MED ORDER — DEXAMETHASONE SODIUM PHOSPHATE 10 MG/ML IJ SOLN
INTRAMUSCULAR | Status: AC
  Filled 2020-09-24: qty 1

## 2020-09-24 MED ORDER — ONDANSETRON 8 MG PO TBDP
8.0000 mg | ORAL_TABLET | Freq: Three times a day (TID) | ORAL | 0 refills | Status: DC | PRN
Start: 2020-09-24 — End: 2020-09-24

## 2020-09-24 MED ORDER — ONDANSETRON HCL 4 MG/2ML IJ SOLN
INTRAMUSCULAR | Status: AC
  Filled 2020-09-24: qty 4

## 2020-09-24 MED ORDER — ONDANSETRON 8 MG PO TBDP
8.0000 mg | ORAL_TABLET | Freq: Three times a day (TID) | ORAL | 0 refills | Status: DC | PRN
Start: 2020-09-24 — End: 2021-04-20

## 2020-09-24 MED ORDER — ALBUTEROL SULFATE HFA 108 (90 BASE) MCG/ACT IN AERS
INHALATION_SPRAY | RESPIRATORY_TRACT | Status: AC
  Filled 2020-09-24: qty 6.7

## 2020-09-24 NOTE — ED Triage Notes (Signed)
Pt presents with severe nausea and vomiting xs 2-3 hours. States nausea started yesterday.

## 2020-09-24 NOTE — Discharge Instructions (Addendum)
2 puffs from albuterol inhaler every 4 hours for 2 days, then every 6 hours for 2 days, then as needed.  Make sure you use your spacer.  You can back off on the albuterol if you start to feel better sooner.  Take the steroids tomorrow.  You will not need any further steroids after that.  Zofran, push electrolyte containing fluids until your urine is clear.  Go immediately to the emergency department if you cannot keep any fluids down despite Zofran, your abdominal pain changes or gets worse, if you need your albuterol inhaler every few hours, or for other concerns  Below is a list of primary care practices who are taking new patients for you to follow-up with.  [facility] internal medicine clinic Ground Floor - [facility], [address] [place], [place], [place] [address] [phone]  [facility] at [facility] [address] [place], [place] [address] [phone]  [facility] [address] [place], [place] [address] [phone]  [facility] [phone] [address] [place] [address]  [facility]: [address] [place] [address]  [phone]  [facility]: [address] [place] [address]  [phone]  [facility] : [address] [place] [address] [phone]  [facility]: [address] [place] [address] [phone]  [facility]: [address] [place] [address] [phone]  Dr. Oneal Grout [address] [facility] [place] [address]  [phone]  Go to www.goodrx.com  or www.costplusdrugs.com to look up your medications. This will give you a list of where you can find your prescriptions at the most affordable prices. Or ask the pharmacist what  the cash price is, or if they have any other discount programs available to help make your medication more affordable. This can be less expensive than what you would pay with insurance.
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
## 2020-09-25 LAB — SARS CORONAVIRUS 2 (TAT 6-24 HRS): SARS Coronavirus 2: NEGATIVE

## 2020-10-06 ENCOUNTER — Encounter: Payer: Self-pay | Admitting: Internal Medicine

## 2020-10-14 DIAGNOSIS — R0602 Shortness of breath: Secondary | ICD-10-CM | POA: Insufficient documentation

## 2020-10-14 DIAGNOSIS — Z5321 Procedure and treatment not carried out due to patient leaving prior to being seen by health care provider: Secondary | ICD-10-CM | POA: Insufficient documentation

## 2020-10-14 DIAGNOSIS — Z20822 Contact with and (suspected) exposure to covid-19: Secondary | ICD-10-CM | POA: Insufficient documentation

## 2020-10-14 DIAGNOSIS — R062 Wheezing: Secondary | ICD-10-CM | POA: Insufficient documentation

## 2020-10-15 ENCOUNTER — Encounter (HOSPITAL_COMMUNITY): Payer: Self-pay | Admitting: Student

## 2020-10-15 ENCOUNTER — Emergency Department (HOSPITAL_COMMUNITY)
Admission: EM | Admit: 2020-10-15 | Discharge: 2020-10-15 | Disposition: A | Discharge status: Home / Self Care | Payer: Self-pay | Attending: Student | Admitting: Student

## 2020-10-15 ENCOUNTER — Other Ambulatory Visit: Payer: Self-pay

## 2020-10-15 ENCOUNTER — Emergency Department (HOSPITAL_COMMUNITY): Payer: Self-pay

## 2020-10-15 LAB — RESP PANEL BY RT-PCR (FLU A&B, COVID) ARPGX2
Influenza A by PCR: NEGATIVE
Influenza B by PCR: NEGATIVE
SARS Coronavirus 2 by RT PCR: NEGATIVE

## 2020-10-15 MED ORDER — AEROCHAMBER PLUS FLO-VU LARGE MISC: 1.0000 | Freq: Once | Status: DC

## 2020-10-15 MED ORDER — ALBUTEROL SULFATE HFA 108 (90 BASE) MCG/ACT IN AERS
2.0000 | INHALATION_SPRAY | Freq: Once | RESPIRATORY_TRACT | Status: AC
  Administered 2020-10-15: 2 via RESPIRATORY_TRACT
  Filled 2020-10-15: qty 6.7

## 2020-10-15 NOTE — ED Notes (Signed)
Pt called 3x for vitals no answer  

## 2020-10-15 NOTE — ED Notes (Signed)
Pt called 2x no answer 

## 2020-10-15 NOTE — ED Provider Notes (Signed)
Emergency Medicine Provider Triage Evaluation Note  Aaron Blankenship , a 30 y.o. male  was evaluated in triage.  Pt complains of dyspnea x 1-2 days. Constant, no alleviating/aggravating factors. Associated cough productive of yellow phlegm sputum and wheezing. Hx of similar in the past. Smokes cigarettes, denies drug use.   Review of Systems  Positive: Cough, dyspnea, wheezing.  Negative: Fever, chest pain, vomiting.   Physical Exam  BP (!) 143/81   Pulse 83   Temp 98.4 F (36.9 C) (Oral)   Resp 16   SpO2 98%  Gen:   Awake, no distress   Resp:  Normal effort  MSK:   Moves extremities without difficulty  Other:  Wheezing throughout. Mildly tachypneic initially- improved. SpO2 98% on RA.   Medical Decision Making  Medically screening exam initiated at 1:01 AM.  Appropriate orders placed.  Aaron Blankenship was informed that the remainder of the evaluation will be completed by another provider, this initial triage assessment does not replace that evaluation, and the importance of remaining in the ED until their evaluation is complete.  Shortness of breath.    Desmond Lope 10/15/20 0104    Zadie Rhine, MD 10/15/20 [phone]

## 2020-10-15 NOTE — ED Triage Notes (Signed)
Pt states sob.  Wheezing noted.  Afebrile.

## 2020-11-09 ENCOUNTER — Ambulatory Visit (HOSPITAL_COMMUNITY)
Admission: EM | Admit: 2020-11-09 | Discharge: 2020-11-09 | Disposition: A | Discharge status: Home / Self Care | Payer: Self-pay | Attending: Student | Admitting: Student

## 2020-11-09 ENCOUNTER — Encounter (HOSPITAL_COMMUNITY): Payer: Self-pay | Admitting: Emergency Medicine

## 2020-11-09 ENCOUNTER — Other Ambulatory Visit: Payer: Self-pay

## 2020-11-09 DIAGNOSIS — J4521 Mild intermittent asthma with (acute) exacerbation: Secondary | ICD-10-CM

## 2020-11-09 HISTORY — DX: Unspecified asthma, uncomplicated: J45.909

## 2020-11-09 LAB — POC INFLUENZA A AND B ANTIGEN (URGENT CARE ONLY)
INFLUENZA A ANTIGEN, POC: NEGATIVE
INFLUENZA B ANTIGEN, POC: NEGATIVE

## 2020-11-09 MED ORDER — ALBUTEROL SULFATE HFA 108 (90 BASE) MCG/ACT IN AERS
6.0000 | INHALATION_SPRAY | Freq: Once | RESPIRATORY_TRACT | Status: AC
  Administered 2020-11-09: 6 via RESPIRATORY_TRACT

## 2020-11-09 MED ORDER — PROMETHAZINE-DM 6.25-15 MG/5ML PO SYRP
5.0000 mL | ORAL_SOLUTION | Freq: Four times a day (QID) | ORAL | 0 refills | Status: DC | PRN
Start: 2020-11-09 — End: 2021-04-20

## 2020-11-09 MED ORDER — METHYLPREDNISOLONE SODIUM SUCC 125 MG IJ SOLR
INTRAMUSCULAR | Status: AC
  Filled 2020-11-09: qty 2

## 2020-11-09 MED ORDER — PREDNISONE 20 MG PO TABS
40.0000 mg | ORAL_TABLET | Freq: Every day | ORAL | 0 refills | Status: AC
Start: 2020-11-09 — End: 2020-11-14

## 2020-11-09 MED ORDER — METHYLPREDNISOLONE SODIUM SUCC 125 MG IJ SOLR
60.0000 mg | Freq: Once | INTRAMUSCULAR | Status: AC
  Administered 2020-11-09: 60 mg via INTRAMUSCULAR

## 2020-11-09 MED ORDER — ALBUTEROL SULFATE HFA 108 (90 BASE) MCG/ACT IN AERS: 1.0000 | INHALATION_SPRAY | Freq: Four times a day (QID) | RESPIRATORY_TRACT | 0 refills | Status: DC | PRN

## 2020-11-09 MED ORDER — ALBUTEROL SULFATE HFA 108 (90 BASE) MCG/ACT IN AERS
INHALATION_SPRAY | RESPIRATORY_TRACT | Status: AC
  Filled 2020-11-09: qty 6.7

## 2020-11-09 NOTE — Discharge Instructions (Addendum)
-  Prednisone, 2 pills taken at the same time for 5 days in a row.  Try taking this earlier in the day as it can give you energy. Avoid NSAIDs like ibuprofen and alleve while taking this medication as they can increase your risk of stomach upset and even GI bleeding when in combination with a steroid. You can continue tylenol (acetaminophen) up to 1000mg  3x daily. -Promethazine DM cough syrup for congestion/cough. This could make you drowsy, so take at night before bed. -Albuterol inhaler as needed for cough, wheezing, shortness of breath, 1 to 2 puffs every 6 hours as needed. Can increase to 6 puffs if necessary.  -Follow-up if symptoms getting worse instead of better.

## 2020-11-09 NOTE — ED Triage Notes (Signed)
Pt reports SOB and asthma flare up started on Sunday and had been using inhaler but has since ran out.
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
## 2021-02-05 ENCOUNTER — Ambulatory Visit (HOSPITAL_COMMUNITY)
Admission: EM | Admit: 2021-02-05 | Discharge: 2021-02-05 | Disposition: A | Discharge status: Home / Self Care | Payer: No Payment, Other

## 2021-02-05 DIAGNOSIS — F1120 Opioid dependence, uncomplicated: Secondary | ICD-10-CM

## 2021-02-05 NOTE — Progress Notes (Signed)
Patient is a 31 year old male that presents this date as a voluntary walk in to [facility] requesting assistance with ongoing SA issues. Patient is denying any current S/I, H/I or AVH. Patient is denying any prior mental health diagnosis or history of OP care coordination. Patient reports daily use of various opiates to include oxycodone, hydrocodone, roxycontin  and heroin in amounts of a half a gram or more for the last 8 months with last use prior to arrival when he reported he ingested about 1/2 a gram of heroin. Patient's route of ingestion is snorting. Patient reports sporadic THC use although denies the use of any other substances. Patient states he has been in treatment last year in [place] for a week to assist with withdrawals (patient cannot recall name) although states he soon relapsed after being discharged. Patient is requesting assistance with ongoing use.

## 2021-02-05 NOTE — Discharge Instructions (Signed)
Take all medications as prescribed. Keep all follow-up appointments as scheduled.  Do not consume alcohol or use illegal drugs while on prescription medications. Report any adverse effects from your medications to your primary care provider promptly.  In the event of recurrent symptoms or worsening symptoms, call 911, a crisis hotline, or go to the nearest emergency department for evaluation.   

## 2021-02-05 NOTE — ED Provider Notes (Signed)
[facility]al [facility] Urgent Care Medical Screening Exam  Patient Name: Aaron Blankenship MRN: [id number] Date of Evaluation: 02/05/21 Chief Complaint:   Diagnosis:  Final diagnoses:  None    History of Present illness: Aaron Blankenship is a 31 y.o. male.  Presents to [facility] urgent care seeking resources for detox from opiates.  Patient was offered facility based crisis however declined due to Suboxone/Subutex not being offered.  Patient will be provided with additional outpatient resources.  Aaron Blankenship is denying suicidal or homicidal ideations.  Denies auditory or visual hallucinations.  Denies cravings or withdrawal symptoms currently.  He reports " I need to stay in a facility at least a week if not longer."  We will make additional outpatient resources available.  Support,  encouragement and reassurance was provided.  During evaluation Aaron Blankenship is sitting  in no acute distress.  He is alert/oriented x 4; calm/cooperative; and mood congruent with affect. He is speaking in a clear tone at moderate volume, and normal pace; with good eye contact. His thought process is coherent and relevant; There is no indication that he  is currently responding to internal/external stimuli or experiencing delusional thought content; and he has denied suicidal/self-harm/homicidal ideation, psychosis, and paranoia.   Patient has remained calm throughout assessment and has answered questions appropriately.     At this time Aaron Blankenship is educated and verbalizes understanding of mental health resources and other crisis services in the community. He is instructed to call 911 and present to the nearest emergency room should he experience any suicidal/homicidal ideation, auditory/visual/hallucinations, or detrimental worsening of his mental health condition. He was a also advised by Probation officer that he could call the toll-free phone on insurance card to assist with identifying in network counselors and agencies or  number on back of Medicaid card to speak with care coordinator   Psychiatric Specialty Exam  Presentation  General Appearance:Appropriate for Environment  Eye Contact:Good  Speech:Clear and Coherent  Speech Volume:No data recorded Handedness:Right   Mood and Affect  Mood:Depressed; Anxious  Affect:Congruent   Thought Process  Thought Processes:Coherent  Descriptions of Associations:Intact  Orientation:Full (Time, Place and Person)  Thought Content:Logical    Hallucinations:None  Ideas of Reference:None  Suicidal Thoughts:No  Homicidal Thoughts:No   Sensorium  Memory:Immediate Fair  Judgment:Fair  Insight:Fair   Executive Functions  Concentration:Fair  Attention Span:Good  [place] of Knowledge:Good  Language:Good   Psychomotor Activity  Psychomotor Activity:Normal   Assets  Assets:Social Support; Desire for Improvement   Sleep  Sleep:Fair  Number of hours: No data recorded  Nutritional Assessment (For OBS and [facility] admissions only) Has the patient had a weight loss or gain of 10 pounds or more in the last 3 months?: No Has the patient had a decrease in food intake/or appetite?: No Does the patient have dental problems?: No Does the patient have eating habits or behaviors that may be indicators of an eating disorder including binging or inducing vomiting?: No Has the patient recently lost weight without trying?: 0 Has the patient been eating poorly because of a decreased appetite?: 0 Malnutrition Screening Tool Score: 0    Physical Exam: Physical Exam Vitals and nursing note reviewed.  Cardiovascular:     Rate and Rhythm: Normal rate and regular rhythm.  Neurological:     Mental Status: He is oriented to person, place, and time.  Psychiatric:        Mood and Affect: Mood normal.        Thought Content: Thought content  normal.   Review of Systems  Psychiatric/Behavioral:  Positive for depression and substance abuse.    All other systems reviewed and are negative. Blood pressure (!) 135/91, pulse 82, temperature 98.2 F (36.8 C), resp. rate 16, SpO2 98 %. There is no height or weight on file to calculate BMI.  Musculoskeletal: Strength & Muscle Tone: within normal limits Gait & Station: normal Patient leans: N/A   [place] [facility] Discharge Disposition for Follow up and Recommendations: Based on my evaluation the patient does not appear to have an emergency medical condition and can be discharged with resources and follow up care in outpatient services for Substance Abuse Intensive Outpatient Program   Derrill [place], NP 02/05/2021, 5:21 PM

## 2021-02-05 NOTE — ED Notes (Signed)
Discharge instructions provided and Pt stated understanding. Pt alert, orient and ambulatory prior to d/c from facility. Safety maintained.   

## 2021-02-13 ENCOUNTER — Other Ambulatory Visit: Payer: Self-pay

## 2021-02-13 ENCOUNTER — Emergency Department (HOSPITAL_COMMUNITY)
Admission: EM | Admit: 2021-02-13 | Discharge: 2021-02-13 | Disposition: A | Discharge status: Home / Self Care | Payer: Self-pay | Attending: Emergency Medicine | Admitting: Emergency Medicine

## 2021-02-13 ENCOUNTER — Encounter (HOSPITAL_COMMUNITY): Payer: Self-pay

## 2021-02-13 DIAGNOSIS — Z79899 Other long term (current) drug therapy: Secondary | ICD-10-CM | POA: Insufficient documentation

## 2021-02-13 DIAGNOSIS — R109 Unspecified abdominal pain: Secondary | ICD-10-CM | POA: Insufficient documentation

## 2021-02-13 DIAGNOSIS — M791 Myalgia, unspecified site: Secondary | ICD-10-CM | POA: Insufficient documentation

## 2021-02-13 DIAGNOSIS — R197 Diarrhea, unspecified: Secondary | ICD-10-CM | POA: Insufficient documentation

## 2021-02-13 DIAGNOSIS — F1123 Opioid dependence with withdrawal: Secondary | ICD-10-CM | POA: Insufficient documentation

## 2021-02-13 DIAGNOSIS — F1193 Opioid use, unspecified with withdrawal: Secondary | ICD-10-CM

## 2021-02-13 MED ORDER — ONDANSETRON 8 MG PO TBDP: 8.0000 mg | ORAL_TABLET | Freq: Three times a day (TID) | ORAL | 0 refills | Status: DC | PRN

## 2021-02-13 MED ORDER — CLONIDINE HCL 0.1 MG PO TABS: 0.1000 mg | ORAL_TABLET | Freq: Two times a day (BID) | ORAL | 0 refills | Status: DC | PRN

## 2021-02-13 MED ORDER — KETOROLAC TROMETHAMINE 60 MG/2ML IM SOLN
60.0000 mg | Freq: Once | INTRAMUSCULAR | Status: AC
  Administered 2021-02-13: 60 mg via INTRAMUSCULAR
  Filled 2021-02-13: qty 2

## 2021-02-13 MED ORDER — METHOCARBAMOL 500 MG PO TABS
750.0000 mg | ORAL_TABLET | Freq: Once | ORAL | Status: AC
  Administered 2021-02-13: 750 mg via ORAL
  Filled 2021-02-13: qty 2

## 2021-02-13 MED ORDER — CLONIDINE HCL 0.1 MG PO TABS
0.1000 mg | ORAL_TABLET | Freq: Once | ORAL | Status: AC
  Administered 2021-02-13: 0.1 mg via ORAL
  Filled 2021-02-13: qty 1

## 2021-02-13 MED ORDER — ONDANSETRON 8 MG PO TBDP
8.0000 mg | ORAL_TABLET | Freq: Once | ORAL | Status: AC
  Administered 2021-02-13: 8 mg via ORAL
  Filled 2021-02-13: qty 1

## 2021-02-13 MED ORDER — IBUPROFEN 800 MG PO TABS: 800.0000 mg | ORAL_TABLET | Freq: Once | ORAL | Status: DC

## 2021-02-13 MED ORDER — METHOCARBAMOL 500 MG PO TABS: 500.0000 mg | ORAL_TABLET | Freq: Two times a day (BID) | ORAL | 0 refills | Status: DC

## 2021-02-13 NOTE — ED Provider Notes (Addendum)
[facility]-EMERGENCY DEPT Provider Note   CSN: [id number] Arrival date & time: 02/13/21  1829     History  Chief Complaint  Patient presents with   Withdrawal    Aaron Blankenship is a 31 y.o. male.  31 year old male presents with request for detox from fentanyl.  Old record review showed that he was seen at the behavior health urgent care center for similar symptoms a few days ago and left because they did not do Suboxone treatment.  Patient last used fentanyl today.  He does have some abdominal cramping and diarrhea.  Denies any emesis.  No SI or HI.  Also endorses myalgias without fevers      Home Medications Prior to Admission medications   Medication Sig Start Date End Date Taking? Authorizing Provider  albuterol (VENTOLIN HFA) 108 (90 Base) MCG/ACT inhaler Inhale 1-2 puffs into the lungs every 6 (six) hours as needed for wheezing or shortness of breath. 11/09/20   Rhys Martini, PA-C  ondansetron (ZOFRAN ODT) 8 MG disintegrating tablet Take 1 tablet (8 mg total) by mouth every 8 (eight) hours as needed for nausea or vomiting. 1/2-1 tablet every 8 hours as needed 09/24/20   Domenick Gong, MD  promethazine-dextromethorphan (PROMETHAZINE-DM) 6.25-15 MG/5ML syrup Take 5 mLs by mouth 4 (four) times daily as needed for cough. 11/09/20   Rhys Martini, PA-C      Allergies    Patient has no known allergies.    Review of Systems   Review of Systems  All other systems reviewed and are negative.  Physical Exam Updated Vital Signs BP (!) 141/99 (BP Location: Right Arm)    Pulse 94    Temp 98.1 F (36.7 C) (Oral)    Resp 18    Ht 1.753 m (5\' 9" )    Wt 70.3 kg    SpO2 100%    BMI 22.89 kg/m  Physical Exam Vitals and nursing note reviewed.  Constitutional:      General: He is not in acute distress.    Appearance: Normal appearance. He is well-developed. He is not toxic-appearing.  HENT:     Head: Normocephalic and atraumatic.  Eyes:     General: Lids  are normal.     Conjunctiva/sclera: Conjunctivae normal.     Pupils: Pupils are equal, round, and reactive to light.  Neck:     Thyroid: No thyroid mass.     Trachea: No tracheal deviation.  Cardiovascular:     Rate and Rhythm: Normal rate and regular rhythm.     Heart sounds: Normal heart sounds. No murmur heard.   No gallop.  Pulmonary:     Effort: Pulmonary effort is normal. No respiratory distress.     Breath sounds: Normal breath sounds. No stridor. No decreased breath sounds, wheezing, rhonchi or rales.  Abdominal:     General: There is no distension.     Palpations: Abdomen is soft.     Tenderness: There is no abdominal tenderness. There is no rebound.  Musculoskeletal:        General: No tenderness. Normal range of motion.     Cervical back: Normal range of motion and neck supple.  Skin:    General: Skin is warm and dry.     Findings: No abrasion or rash.  Neurological:     Mental Status: He is alert and oriented to person, place, and time. Mental status is at baseline.     GCS: GCS eye subscore is 4. GCS verbal  subscore is 5. GCS motor subscore is 6.     Cranial Nerves: No cranial nerve deficit.     Sensory: No sensory deficit.     Motor: Motor function is intact.  Psychiatric:        Attention and Perception: Attention normal.        Mood and Affect: Mood is anxious.        Speech: Speech is rapid and pressured.        Thought Content: Thought content does not include homicidal or suicidal ideation.    ED Results / Procedures / Treatments   Labs (all labs ordered are listed, but only abnormal results are displayed) Labs Reviewed - No data to display  EKG None  Radiology No results found.  Procedures Procedures    Medications Ordered in ED Medications  ondansetron (ZOFRAN-ODT) disintegrating tablet 8 mg (has no administration in time range)  methocarbamol (ROBAXIN) tablet 750 mg (has no administration in time range)  cloNIDine (CATAPRES) tablet 0.1 mg  (has no administration in time range)  ketorolac (TORADOL) injection 60 mg (has no administration in time range)    ED Course/ Medical Decision Making/ A&P                           Medical Decision Making Risk Prescription drug management.   Patient's vital signs noted here.  He is not tachycardic.  Patient given medication for opiate withdrawal here.  Will prescribe clonidine for outpatient management as well as Robaxin.  Will give outpatient resources        Final Clinical Impression(s) / ED Diagnoses Final diagnoses:  None    Rx / DC Orders ED Discharge Orders     None         Lorre Nick, MD 02/13/21 1936    Lorre Nick, MD 02/13/21 2114

## 2021-02-13 NOTE — ED Notes (Signed)
Pt discharged. Instructions and prescriptions given. AAOX4. Pt in no apparent distress with moderate pain. The opportunity to ask questions was provided.  

## 2021-02-13 NOTE — ED Triage Notes (Signed)
Patient reports that he is withdrawing from Fentanyl. Patient states he last used last night.

## 2021-02-13 NOTE — Discharge Instructions (Signed)
I strongly encourage you to [facility]

## 2021-02-14 ENCOUNTER — Emergency Department (HOSPITAL_COMMUNITY)
Admission: EM | Admit: 2021-02-14 | Discharge: 2021-02-14 | Disposition: A | Discharge status: Home / Self Care | Payer: Self-pay | Attending: Emergency Medicine | Admitting: Emergency Medicine

## 2021-02-14 DIAGNOSIS — F11288 Opioid dependence with other opioid-induced disorder: Secondary | ICD-10-CM | POA: Insufficient documentation

## 2021-02-14 DIAGNOSIS — R197 Diarrhea, unspecified: Secondary | ICD-10-CM | POA: Insufficient documentation

## 2021-02-14 DIAGNOSIS — F1123 Opioid dependence with withdrawal: Secondary | ICD-10-CM

## 2021-02-14 DIAGNOSIS — M791 Myalgia, unspecified site: Secondary | ICD-10-CM | POA: Insufficient documentation

## 2021-02-14 LAB — BASIC METABOLIC PANEL
Anion gap: 8 (ref 5–15)
BUN: 9 mg/dL (ref 6–20)
CO2: 29 mmol/L (ref 22–32)
Calcium: 9 mg/dL (ref 8.9–10.3)
Chloride: 101 mmol/L (ref 98–111)
Creatinine, Ser: 0.77 mg/dL (ref 0.61–1.24)
GFR, Estimated: >60 mL/min (ref >60)
Glucose, Bld: 126 mg/dL — ABNORMAL HIGH (ref 70–99)
Potassium: 3.9 mmol/L (ref 3.5–5.1)
Sodium: 138 mmol/L (ref 135–145)

## 2021-02-14 LAB — CBC
HCT: 36.8 % — ABNORMAL LOW (ref 39.0–52.0)
Hemoglobin: 12.5 g/dL — ABNORMAL LOW (ref 13.0–17.0)
MCH: 28.3 pg (ref 26.0–34.0)
MCHC: 34 g/dL (ref 30.0–36.0)
MCV: 83.3 fL (ref 80.0–100.0)
Platelets: 339 10*3/uL (ref 150–400)
RBC: 4.42 MIL/uL (ref 4.22–5.81)
RDW: 13.1 % (ref 11.5–15.5)
WBC: 10.1 10*3/uL (ref 4.0–10.5)
nRBC: 0 % (ref 0.0–0.2)

## 2021-02-14 MED ORDER — METHOCARBAMOL 500 MG PO TABS
500.0000 mg | ORAL_TABLET | Freq: Once | ORAL | Status: AC
  Administered 2021-02-14: 500 mg via ORAL
  Filled 2021-02-14: qty 1

## 2021-02-14 MED ORDER — ACETAMINOPHEN 500 MG PO TABS
1000.0000 mg | ORAL_TABLET | Freq: Once | ORAL | Status: AC
  Administered 2021-02-14: 1000 mg via ORAL
  Filled 2021-02-14: qty 2

## 2021-02-14 MED ORDER — IBUPROFEN 800 MG PO TABS
800.0000 mg | ORAL_TABLET | Freq: Once | ORAL | Status: AC
  Administered 2021-02-14: 800 mg via ORAL
  Filled 2021-02-14: qty 1

## 2021-02-14 MED ORDER — DICYCLOMINE HCL 10 MG PO CAPS
20.0000 mg | ORAL_CAPSULE | Freq: Once | ORAL | Status: AC
  Administered 2021-02-14: 20 mg via ORAL
  Filled 2021-02-14: qty 2

## 2021-02-14 MED ORDER — ONDANSETRON 4 MG PO TBDP
8.0000 mg | ORAL_TABLET | Freq: Once | ORAL | Status: AC
  Administered 2021-02-14: 8 mg via ORAL
  Filled 2021-02-14: qty 2

## 2021-02-14 MED ORDER — METHOCARBAMOL 500 MG PO TABS: 750.0000 mg | ORAL_TABLET | Freq: Once | ORAL | Status: DC

## 2021-02-14 NOTE — ED Provider Triage Note (Signed)
Emergency Medicine Provider Triage Evaluation Note  Aaron Blankenship , a 31 y.o. male  was evaluated in triage.  Pt complains of opioid withdrawal. Pt was seen at [facility] yesterday and discharged with clonidine and outpatient resources. After he left the ER he used fentanyl again. He presented to an inpatient treatment center today that said they wouldn't take him because he is "not medically stable".   Review of Systems  Positive: Body aches, headache Negative: Chest pain, SOB, suicidal ideation  Physical Exam  BP 116/74 (BP Location: Right Arm)    Pulse 93    Temp 98.2 F (36.8 C) (Oral)    Resp 17    SpO2 100%  Gen:   Awake, no distress   Resp:  Normal effort  MSK:   Moves extremities without difficulty  Other:  Intermittently falling asleep and yelling in triage  Medical Decision Making  Medically screening exam initiated at 9:22 AM.  Appropriate orders placed.  Aaron Blankenship was informed that the remainder of the evaluation will be completed by another provider, this initial triage assessment does not replace that evaluation, and the importance of remaining in the ED until their evaluation is complete.  Will give motrin and muscle relaxer in triage. Advised patient's sister that we do not have suboxone program here but will medically observe him until he is cleared to go back to treatment center.    Su Monks, [place] 02/14/21 4818

## 2021-02-14 NOTE — ED Provider Notes (Signed)
[facility] EMERGENCY DEPARTMENT Provider Note   CSN: [id number] Arrival date & time: 02/14/21  0849     History  Chief Complaint  Patient presents with   Drug Problem    Dartanian Knaggs is a 31 y.o. male.  Patient indicates here with issues with opiate/substance use disorder. Hx same. Notes intermittent body aches, nausea, diarrhea. Symptoms occur when not using. Denies etoh abuse problems. No hx seizures. Denies trauma, fall or injury. Is eating/drinking. Denies depression. Pt limited historian, relatively poorly cooperative w hx - level 5 caveat. Family provides additional hx. No fevers.   The history is provided by the patient, medical records and a relative. The history is limited by the condition of the patient.  Drug Problem Pertinent negatives include no chest pain, no abdominal pain, no headaches and no shortness of breath.      Home Medications Prior to Admission medications   Medication Sig Start Date End Date Taking? Authorizing Provider  albuterol (VENTOLIN HFA) 108 (90 Base) MCG/ACT inhaler Inhale 1-2 puffs into the lungs every 6 (six) hours as needed for wheezing or shortness of breath. 11/09/20   Rhys Martini, PA-C  cloNIDine (CATAPRES) 0.1 MG tablet Take 1 tablet (0.1 mg total) by mouth 2 (two) times daily as needed (opiate withdrawl symptoms). 02/13/21   Lorre Nick, MD  methocarbamol (ROBAXIN) 500 MG tablet Take 1 tablet (500 mg total) by mouth 2 (two) times daily. 02/13/21   Lorre Nick, MD  ondansetron (ZOFRAN ODT) 8 MG disintegrating tablet Take 1 tablet (8 mg total) by mouth every 8 (eight) hours as needed for nausea or vomiting. 1/2-1 tablet every 8 hours as needed 09/24/20   Domenick Gong, MD  ondansetron (ZOFRAN-ODT) 8 MG disintegrating tablet Take 1 tablet (8 mg total) by mouth every 8 (eight) hours as needed for nausea or vomiting. 02/13/21   Lorre Nick, MD  promethazine-dextromethorphan (PROMETHAZINE-DM) 6.25-15 MG/5ML syrup Take  5 mLs by mouth 4 (four) times daily as needed for cough. 11/09/20   Rhys Martini, PA-C      Allergies    Patient has no known allergies.    Review of Systems   Review of Systems  Constitutional:  Negative for fever.  Respiratory:  Negative for shortness of breath.   Cardiovascular:  Negative for chest pain.  Gastrointestinal:  Positive for nausea. Negative for abdominal pain.  Neurological:  Negative for headaches.  Psychiatric/Behavioral:  Negative for suicidal ideas.    Physical Exam Updated Vital Signs BP 116/74 (BP Location: Right Arm)    Pulse 93    Temp 98.2 F (36.8 C) (Oral)    Resp 17    SpO2 100%  Physical Exam Vitals and nursing note reviewed.  Constitutional:      Appearance: Normal appearance. He is well-developed.  HENT:     Head: Atraumatic.     Nose: Nose normal.     Mouth/Throat:     Mouth: Mucous membranes are moist.     Pharynx: Oropharynx is clear.  Eyes:     General: No scleral icterus.    Conjunctiva/sclera: Conjunctivae normal.     Pupils: Pupils are equal, round, and reactive to light.  Neck:     Trachea: No tracheal deviation.  Cardiovascular:     Rate and Rhythm: Normal rate and regular rhythm.     Pulses: Normal pulses.     Heart sounds: Normal heart sounds. No murmur heard.   No friction rub. No gallop.  Pulmonary:  Effort: Pulmonary effort is normal. No accessory muscle usage or respiratory distress.     Breath sounds: Normal breath sounds.  Abdominal:     General: Bowel sounds are normal. There is no distension.     Palpations: Abdomen is soft.     Tenderness: There is no abdominal tenderness. There is no guarding.  Genitourinary:    Comments: No cva tenderness. Musculoskeletal:        General: No swelling.     Cervical back: Normal range of motion and neck supple. No rigidity.  Skin:    General: Skin is warm and dry.     Findings: No rash.  Neurological:     Mental Status: He is alert.     Comments: Alert, speech clear.    Psychiatric:        Mood and Affect: Mood normal.    ED Results / Procedures / Treatments   Labs (all labs ordered are listed, but only abnormal results are displayed) Results for orders placed or performed during the hospital encounter of 02/14/21  Basic metabolic panel  Result Value Ref Range   Sodium 138 135 - 145 mmol/L   Potassium 3.9 3.5 - 5.1 mmol/L   Chloride 101 98 - 111 mmol/L   CO2 29 22 - 32 mmol/L   Glucose, Bld 126 (H) 70 - 99 mg/dL   BUN 9 6 - 20 mg/dL   Creatinine, Ser 1.61 0.61 - 1.24 mg/dL   Calcium 9.0 8.9 - 09.6 mg/dL   GFR, Estimated >04 >54 mL/min   Anion gap 8 5 - 15  CBC  Result Value Ref Range   WBC 10.1 4.0 - 10.5 K/uL   RBC 4.42 4.22 - 5.81 MIL/uL   Hemoglobin 12.5 (L) 13.0 - 17.0 g/dL   HCT 09.8 (L) 11.9 - 14.7 %   MCV 83.3 80.0 - 100.0 fL   MCH 28.3 26.0 - 34.0 pg   MCHC 34.0 30.0 - 36.0 g/dL   RDW 82.9 56.2 - 13.0 %   Platelets 339 150 - 400 K/uL   nRBC 0.0 0.0 - 0.2 %     EKG None  Radiology No results found.  Procedures Procedures    Medications Ordered in ED Medications  methocarbamol (ROBAXIN) tablet 750 mg (has no administration in time range)  acetaminophen (TYLENOL) tablet 1,000 mg (has no administration in time range)  ondansetron (ZOFRAN-ODT) disintegrating tablet 8 mg (has no administration in time range)  ibuprofen (ADVIL) tablet 800 mg (800 mg Oral Given 02/14/21 0937)  methocarbamol (ROBAXIN) tablet 500 mg (500 mg Oral Given 02/14/21 8657)    ED Course/ Medical Decision Making/ A&P                           Medical Decision Making Amount and/or Complexity of Data Reviewed Labs: ordered.  Risk OTC drugs. Prescription drug management.  Labs sent.  Reviewed nursing notes and prior charts for additional history.  External reported reviewed, including recent [facility] notes. Additional hx provided by family.  Labs reviewed/interpreted by me - chem normal. Wbc normal.   Zofran po, acetaminophen po, robaxin po for  symptom relief. Bentyl po.  Po fluids/food.   Vitals are normal, pt appears in no acute distress, breathing comfortably.  Pt ambulates to bathroom with steady gait.   Tolerating po, no emesis.   On recheck, is calm and alert, and appears to be in no acute distress.   Will provide resource guide for  treatment programs in area.   Return precautions provided.           Final Clinical Impression(s) / ED Diagnoses Final diagnoses:  None    Rx / DC Orders ED Discharge Orders     None         Cathren Laine, MD 02/14/21 1215

## 2021-02-14 NOTE — Discharge Instructions (Addendum)
It was our pleasure to provide your ER care today - we hope that you feel better.  Avoid opiate use. Use resource guide provided to help access treatment programs in the area.    Also, follow up with primary care doctor in the next couple weeks.   Return to ER if worse, new symptoms, fevers, persistent vomiting, new/severe pain, trouble breathing, or other concern.

## 2021-02-14 NOTE — ED Notes (Signed)
Pt. Stated, The last time I used was yesterday  Fentanyl

## 2021-02-14 NOTE — ED Triage Notes (Signed)
Sisiter stated, he has a drug withdrawal and he has had N/V and hot and cold

## 2021-02-23 ENCOUNTER — Telehealth (HOSPITAL_COMMUNITY): Payer: Self-pay | Admitting: Internal Medicine

## 2021-02-23 NOTE — BH Assessment (Signed)
Care Management - [facility] Follow Up Discharges  ? ?Writer attempted to make contact with patient today and was unsuccessful.  Writer left a HIPPA compliant voice message.  ? ?Per chart review, patient was provided with outpatient substance abuse resources. ? ?

## 2021-04-19 ENCOUNTER — Ambulatory Visit (HOSPITAL_COMMUNITY)
Admission: EM | Admit: 2021-04-19 | Discharge: 2021-04-20 | Disposition: A | Discharge status: Home / Self Care | Payer: No Payment, Other | Attending: Psychiatry | Admitting: Psychiatry

## 2021-04-19 DIAGNOSIS — F1914 Other psychoactive substance abuse with psychoactive substance-induced mood disorder: Secondary | ICD-10-CM | POA: Diagnosis not present

## 2021-04-19 DIAGNOSIS — F19159 Other psychoactive substance abuse with psychoactive substance-induced psychotic disorder, unspecified: Secondary | ICD-10-CM | POA: Diagnosis not present

## 2021-04-19 DIAGNOSIS — F419 Anxiety disorder, unspecified: Secondary | ICD-10-CM | POA: Insufficient documentation

## 2021-04-19 DIAGNOSIS — Z046 Encounter for general psychiatric examination, requested by authority: Secondary | ICD-10-CM

## 2021-04-19 DIAGNOSIS — Z79899 Other long term (current) drug therapy: Secondary | ICD-10-CM | POA: Insufficient documentation

## 2021-04-19 DIAGNOSIS — F1910 Other psychoactive substance abuse, uncomplicated: Secondary | ICD-10-CM

## 2021-04-19 DIAGNOSIS — Z20822 Contact with and (suspected) exposure to covid-19: Secondary | ICD-10-CM | POA: Insufficient documentation

## 2021-04-19 DIAGNOSIS — F32A Depression, unspecified: Secondary | ICD-10-CM | POA: Diagnosis not present

## 2021-04-19 LAB — CBC WITH DIFFERENTIAL/PLATELET
Abs Immature Granulocytes: 0.02 10*3/uL (ref 0.00–0.07)
Basophils Absolute: 0 10*3/uL (ref 0.0–0.1)
Basophils Relative: 0 %
Eosinophils Absolute: 0 10*3/uL (ref 0.0–0.5)
Eosinophils Relative: 1 %
HCT: 40.3 % (ref 39.0–52.0)
Hemoglobin: 13.7 g/dL (ref 13.0–17.0)
Immature Granulocytes: 0 %
Lymphocytes Relative: 13 %
Lymphs Abs: 1.1 10*3/uL (ref 0.7–4.0)
MCH: 28.1 pg (ref 26.0–34.0)
MCHC: 34 g/dL (ref 30.0–36.0)
MCV: 82.8 fL (ref 80.0–100.0)
Monocytes Absolute: 0.4 10*3/uL (ref 0.1–1.0)
Monocytes Relative: 5 %
Neutro Abs: 6.3 10*3/uL (ref 1.7–7.7)
Neutrophils Relative %: 81 %
Platelets: 305 10*3/uL (ref 150–400)
RBC: 4.87 MIL/uL (ref 4.22–5.81)
RDW: 12.8 % (ref 11.5–15.5)
WBC: 7.8 10*3/uL (ref 4.0–10.5)
nRBC: 0 % (ref 0.0–0.2)

## 2021-04-19 LAB — COMPREHENSIVE METABOLIC PANEL
ALT: 17 U/L (ref 0–44)
AST: 11 U/L — ABNORMAL LOW (ref 15–41)
Albumin: 4 g/dL (ref 3.5–5.0)
Alkaline Phosphatase: 62 U/L (ref 38–126)
Anion gap: 8 (ref 5–15)
BUN: 9 mg/dL (ref 6–20)
CO2: 26 mmol/L (ref 22–32)
Calcium: 9.5 mg/dL (ref 8.9–10.3)
Chloride: 105 mmol/L (ref 98–111)
Creatinine, Ser: 0.72 mg/dL (ref 0.61–1.24)
GFR, Estimated: >60 mL/min (ref >60)
Glucose, Bld: 111 mg/dL — ABNORMAL HIGH (ref 70–99)
Potassium: 3.8 mmol/L (ref 3.5–5.1)
Sodium: 139 mmol/L (ref 135–145)
Total Bilirubin: 0.5 mg/dL (ref 0.3–1.2)
Total Protein: 6.7 g/dL (ref 6.5–8.1)

## 2021-04-19 LAB — POC SARS CORONAVIRUS 2 AG -  ED: SARS Coronavirus 2 Ag: NEGATIVE

## 2021-04-19 LAB — HEMOGLOBIN A1C
Hgb A1c MFr Bld: 5.2 % (ref 4.8–5.6)
Mean Plasma Glucose: 102.54 mg/dL

## 2021-04-19 LAB — TSH: TSH: 0.04 u[IU]/mL — ABNORMAL LOW (ref 0.350–4.500)

## 2021-04-19 LAB — LIPID PANEL
Cholesterol: 163 mg/dL (ref 0–200)
HDL: 59 mg/dL (ref >40)
LDL Cholesterol: 86 mg/dL (ref 0–99)
Total CHOL/HDL Ratio: 2.8 RATIO
Triglycerides: 89 mg/dL (ref <150)
VLDL: 18 mg/dL (ref 0–40)

## 2021-04-19 LAB — RESP PANEL BY RT-PCR (FLU A&B, COVID) ARPGX2
Influenza A by PCR: NEGATIVE
Influenza B by PCR: NEGATIVE
SARS Coronavirus 2 by RT PCR: NEGATIVE

## 2021-04-19 LAB — MAGNESIUM: Magnesium: 2 mg/dL (ref 1.7–2.4)

## 2021-04-19 LAB — ETHANOL: Alcohol, Ethyl (B): <10 mg/dL (ref <10)

## 2021-04-19 MED ORDER — OLANZAPINE 10 MG PO TBDP
10.0000 mg | ORAL_TABLET | Freq: Three times a day (TID) | ORAL | Status: DC | PRN
  Filled 2021-04-19: qty 1

## 2021-04-19 MED ORDER — NAPROXEN 500 MG PO TABS
500.0000 mg | ORAL_TABLET | Freq: Two times a day (BID) | ORAL | Status: DC | PRN
  Administered 2021-04-20: 500 mg via ORAL
  Filled 2021-04-19: qty 1

## 2021-04-19 MED ORDER — METHOCARBAMOL 500 MG PO TABS
500.0000 mg | ORAL_TABLET | Freq: Three times a day (TID) | ORAL | Status: DC | PRN
  Administered 2021-04-20: 500 mg via ORAL
  Filled 2021-04-19: qty 1

## 2021-04-19 MED ORDER — LORAZEPAM 1 MG PO TABS
1.0000 mg | ORAL_TABLET | ORAL | Status: AC | PRN
  Administered 2021-04-20: 1 mg via ORAL
  Filled 2021-04-19: qty 1

## 2021-04-19 MED ORDER — HYDROXYZINE HCL 25 MG PO TABS
25.0000 mg | ORAL_TABLET | Freq: Four times a day (QID) | ORAL | Status: DC | PRN
  Administered 2021-04-19 – 2021-04-20 (×2): 25 mg via ORAL
  Filled 2021-04-19 (×3): qty 1

## 2021-04-19 MED ORDER — MAGNESIUM HYDROXIDE 400 MG/5ML PO SUSP: 30.0000 mL | Freq: Every day | ORAL | Status: DC | PRN

## 2021-04-19 MED ORDER — DICYCLOMINE HCL 20 MG PO TABS
20.0000 mg | ORAL_TABLET | Freq: Four times a day (QID) | ORAL | Status: DC | PRN
Start: 2021-04-19 — End: 2021-04-21
  Administered 2021-04-20: 20 mg via ORAL
  Filled 2021-04-19: qty 1

## 2021-04-19 MED ORDER — ACETAMINOPHEN 325 MG PO TABS
650.0000 mg | ORAL_TABLET | Freq: Four times a day (QID) | ORAL | Status: DC | PRN
  Administered 2021-04-20: 650 mg via ORAL
  Filled 2021-04-19: qty 2

## 2021-04-19 MED ORDER — LOPERAMIDE HCL 2 MG PO CAPS: 2.0000 mg | ORAL_CAPSULE | ORAL | Status: DC | PRN

## 2021-04-19 MED ORDER — ALUM & MAG HYDROXIDE-SIMETH 200-200-20 MG/5ML PO SUSP: 30.0000 mL | ORAL | Status: DC | PRN

## 2021-04-19 MED ORDER — ZIPRASIDONE MESYLATE 20 MG IM SOLR: 20.0000 mg | INTRAMUSCULAR | Status: DC | PRN

## 2021-04-19 MED ORDER — ONDANSETRON 4 MG PO TBDP: 4.0000 mg | ORAL_TABLET | Freq: Four times a day (QID) | ORAL | Status: DC | PRN

## 2021-04-19 NOTE — Progress Notes (Signed)
?   04/19/21 1626  ?Aaron Blankenship Triage Screening (Walk-ins at [facility] only)  ?How Did You Hear About [place]? Legal System  ?What Is the Reason for Your Visit/Call Today? Aaron Blankenship is a 31 yo male who presented under IVC brought in by the [employer] and [facility]. Pt was IVC'd by his sister, Aaron Blankenship. Per IVC: " The petitioner states the respondent has not been eating, sleeping or tending to his personal hygiene. The respondent has recently threatened to kill himself by putting a knife to his throat. The respondent has conversations with himself as if speaking to someone else. The respondent  verbally threatens to kill himself then uses bad words toward mother. In addition, there respondent punches himself, spits on and slaps himself. The respondent abuse illegal drugs, fentanyl and unprescribed drugs Xanax almost every day."  Per Triage assessment, Pt denied SI, past suicide attempts, HI, NSSH, AVH and paranoia. Pt reported daily use of Fentanyl and Xanax (pt denied any Xanax use for the last 2-3 weeks.) Pt also reported occasional use of alcohol (every other month) and methamphetamine use weekly with last use 2 days ago. Pt denied any current OP psych providers.  ?How Long Has This Been Causing You Problems? > than 6 months  ?Have You Recently Had Any Thoughts About Hurting Yourself? No  ?Are You Planning to Commit Suicide/Harm Yourself At This time? No  ?Have you Recently Had Thoughts About [place]? No  ?Are You Planning To Harm Someone At This Time? No  ?Are you currently experiencing any auditory, visual or other hallucinations? No  ?Have You Used Any Alcohol or Drugs in the Past 24 Hours? Yes  ?How long ago did you use Drugs or Alcohol? Fentanyl yesterday and daily ongoing  ?Do you have any current medical co-morbidities that require immediate attention? No  ?Clinician description of patient physical appearance/behavior: Pt seemed drowsy and not fully oriented. Pt was casually dressed and seemed adequately groomed. Pt  was calm and cooperative and able to answer all questions. Pt's mood seemed depressed and flat affect seemed congruent. Pt's judgment and insight seemed impaired to poor. Pt did not appeared to be responding to interaal stimuli or experiencing delusional thinking.  ?If access to [facility] was not available, would you have sought care in the Emergency Department? Yes  ?Determination of Need Urgent (48 hours)  ? ?Javier Gell T. Mare Ferrari, [place], [facility], [place] ?Triage Specialist ?[place] ? ?

## 2021-04-19 NOTE — ED Notes (Signed)
Patient resting with no sxs of distress noted - will continue to monitor for safety 

## 2021-04-19 NOTE — Progress Notes (Signed)
Patient admitted to the OBS unit, due to substance abuse, and according to sister "threatening to kill himself." Ptient oriented to the unit, offered food. Patient denies SI, HI and AVH. Quiet and cooperative. Nursing staff will continue to monitor. ?

## 2021-04-19 NOTE — ED Provider Notes (Signed)
[facility] Continuous Assessment Admission H&P ? ?Date: 04/19/21 ?Patient Name: Aaron Blankenship ?MRN: [id number] ?Chief Complaint: No chief complaint on file. ?   ? ?Diagnoses:  ?Final diagnoses:  ?Polysubstance abuse (HCC)  ?Involuntary commitment  ? ? ?HPI: [facility] via [employer] under involuntary commitment.  ? ?Aaron Blankenship, 31 y.o., male patient seen face to face by this provider, consulted with Dr. Bronwen BettersLaubach; and chart reviewed on 04/19/21.  Per chart review patient was [facility] for Opioid Detox from 03/15/2021-3/9/203 he was prescribed subutex at that time. [facility] and [facility] on 02/13/2021 related to opioid use. [facility]21 requested opioid detox be declined due to suboxone/subutex not being offered.  ? ?Per IVC findings: Petitioner is Molli KnockStephanie Schulenburg, patients sister [phone]. The petitioner states: "The respondent has not been eating, sleeping or attending to his personal hygiene.  The respondent has recently threatened to kill himself by putting a knife to his throat.  The respondent has conversations with himself as if he was speaking to someone else.  The respondent verbally threatens to kill himself then uses backwards towards my mother.  In addition the respondent punches himself, spits on and slaps himself.  The respondent abuses illegal drugs, fentanyl, and unprescribed drugs Xanax almost every day". ? ?Patient reports he does not currently have any psychiatric services in place. He does not take any medications.  Patient reports he has been snorting 1 mg Fentanyl daily for roughly 3 years, last use was last night.  He also uses methamphetamines weekly, last use 2 days ago. He endorses occasional alcohol use. He endorses xanax use but not in the past 2-3 weeks. At this time he is endorsing body aches.  He denies other symptoms of withdrawals including shakes, chills, and restlessness.  He has  participated in detox programs and MAT in the past.  ? ?During evaluation Aaron Blankenship is alert/oriented x 3 and cooperative.  He is disheveled and makes no eye contact.  He is drowsy and irritable. He answers questions with short answers.  He denies all the findings in the IVC.  He has a depressed flat affect.  Pateint denies SI/HI/AVH.  Objectively he does not appear to be responding to internal/external stimuli.  He denies paranoid and delusional thought content.  He denies any past suicide attempts.  Reports he only sleeps 2-3 hours per night and has a decreased appetite.  ? ? ?Per Beryle FlockMary Farmer, Counselor note (this writer was present while collateral was being obtained) ? ?COLLATERAL INFORMATION: ?Per petitioner Molli KnockStephanie Tippens, pt's sister, in addition to the information on the IVC narrative, sister stated that: ?Pt overdosed on Xanax about a month ago and was revived by LE. He was not taken in for any medical treatment at that time.  ?Pt has been awake since yesterday with no sleep and often does not sleep.  ?Pt eats daily but only eats candy and/or sweets. ?Pt recently threatened to kill himself and has before and put a knife to his throat. Pt has never actually attempted to kill himself to petitioner's knowledge or knowledge of their mother. (Pt denied any suicide attempts.) ?Pt appears to be responding to internal stimuli just after using drugs per sister. The last time she observed this behavior (talking to himself or someone who was not there) was last night.  ?Pt has also been observed to punch, spit on and slap himself just after using drugs.  The last time he was observed with this behavior was yesterday.  ?Pt has threatened to kill his mother multiple times. Per sister, pt has never actually hurt anyone in the past ? ?Discussed being admitted to the continuous assessment unit for overnight observation.  Explained the milieu and expectations including COVID testing, lab work, and EKG.  Patient  agreed.  Patient is requesting detox from opioids.  Explained that Subutex is not offered at this facility.  Patient verbalized understanding. ? ?  ? ?PHQ 2-9:  ?Flowsheet Row Office Visit from 04/17/2019 in [facility]  ?Thoughts that you would be better off dead, or of hurting yourself in some way Not at all  ?PHQ-9 Total Score 5  ? ?  ?  ?Flowsheet Row ED from 02/14/2021 in [facility] EMERGENCY DEPART[facility] ED from 02[facility]13/2021 in [facility] from 11/09/2020 in [facility]  ?C-SSRS RISK CATEGORY No Risk No Risk No Risk  ? ?  ?  ? ?Total Time spent with patient: 30 minutes ? ?Musculoskeletal  ?Strength & Muscle Tone: within normal limits ?Gait & Station: normal ?Patient leans: N/A ? ?Psychiatric Specialty Exam  ?Presentation ?General Appearance: Disheveled ? ?Eye Contact:None ? ?Speech:Clear and Coherent; Slow ? ?Speech Volume:No data recorded ?Handedness:Right ? ? ?Mood and Affect  ?Mood:Depressed; Irritable ? ?Affect:Congruent; Depressed ? ? ?Thought Process  ?Thought Processes:Coherent ? ?Descriptions of Associations:Intact ? ?Orientation:Full (Time, Place and Person) ? ?Thought Content:Logical ? Diagnosis of Schizophrenia or Schizoaffective disorder in past: No ?  ?Hallucinations:Hallucinations: None ? ?Ideas of Reference:None ? ?Suicidal Thoughts:Suicidal Thoughts: No ? ?Homicidal Thoughts:Homicidal Thoughts: No ? ? ?Sensorium  ?Memory:Immediate Fair; Recent Fair; Remote Fair ? ?Judgment:Poor ? ?Insight:Fair ? ? ?Executive Functions  ?Concentration:Fair ? ?Attention Span:Fair ? ?Recall:Good ? ?Fund of Knowledge:Good ? ?Language:Good ? ? ?Psychomotor Activity  ?Psychomotor Activity:Psychomotor Activity: Decreased ? ? ?Assets  ?Assets:Resilience; Social Support; Physical Health ? ? ?Sleep  ?Sleep:Sleep: Poor ?Number of Hours of Sleep: 3 ? ? ?No data recorded ? ?Physical Exam ?Vitals and nursing note reviewed.   ?Constitutional:   ?   General: He is not in acute distress. ?   Appearance: He is well-developed.  ?HENT:  ?   Head: Normocephalic and atraumatic.  ?Eyes:  ?   General:     ?   Right eye: No discharge.     ?   Left eye: No discharge.  ?Cardiovascular:  ?   Rate and Rhythm: Normal rate.  ?Pulmonary:  ?   Effort: Pulmonary effort is normal. No respiratory distress.  ?Musculoskeletal:     ?   General: Normal range of motion.  ?   Cervical back: Normal range of motion.  ?Skin: ?   Coloration: Skin is not jaundiced or pale.  ?Neurological:  ?   Mental Status: He is alert and oriented to person, place, and time.  ?Psychiatric:     ?   Attention and Perception: He is inattentive.     ?   Mood and Affect: Mood is depressed.     ?   Speech: Speech normal.     ?   Behavior: Behavior is agitated.     ?   Thought Content: Thought content normal.     ?   Cognition and Memory: Cognition normal.     ?   Judgment: Judgment is impulsive.  ? ?Review of Systems  ?Constitutional: Negative.   ?HENT: Negative.    ?Eyes: Negative.   ?  Respiratory: Negative.    ?Cardiovascular: Negative.   ?Musculoskeletal: Negative.   ?Skin: Negative.   ?Neurological: Negative.   ?Psychiatric/Behavioral:  Positive for depression and substance abuse.   ? ?Blood pressure 112/76, pulse 70, temperature 98.5 ?F (36.9 ?C), temperature source Oral, resp. rate 18, SpO2 100 %. There is no height or weight on file to calculate BMI. ? ?Past Psychiatric History: polysubstance abuse   ? ?Is the patient at risk to self? No  ?Has the patient been a risk to self in the past 6 months? No .    ?Has the patient been a risk to self within the distant past? No   ?Is the patient a risk to others? No   ?Has the patient been a risk to others in the past 6 months? No   ?Has the patient been a risk to others within the distant past? No  ? ?Past Medical History:  ?Past Medical History:  ?Diagnosis Date  ? Anxiety   ? with panic disorder  ? Asthma   ? Depression   ? Heroin abuse  (HCC)   ? No past surgical history on file. ? ?Family History:  ?Family History  ?Problem Relation Age of Onset  ? Diabetes Mother   ? Depression Mother   ? Depression Sister   ? ? ?Social History:  ?Social History  ? ?Socioeconom

## 2021-04-19 NOTE — BH Assessment (Signed)
Comprehensive Clinical Assessment (CCA) Note ? ?04/19/2021 ?Aaron Blankenship ?[id number] ? ?Per Aaron Lolling NP, pt is recommended for overnight observation in the [facility] OBS unit and re-assessment tomorrow. ? ?The patient demonstrates the following risk factors for suicide: Chronic risk factors for suicide include: substance use disorder. Acute risk factors for suicide include: family or marital conflict and unemployment. Protective factors for this patient include: positive social support and hope for the future. Considering these factors, the overall suicide risk at this point appears to be low. Patient is appropriate for outpatient follow up. ? ?[place] ED from 04/19/2021 in [facility] ED from 02/14/2021 in [facility] ED from 02/13/2021 in [facility] DEPT  ?C-SSRS RISK CATEGORY No Risk No Risk No Risk  ? ?  ? ?Aaron Blankenship is a 31 yo male who presented under IVC brought in by the [employer] and BHRT. Pt was IVC'd by his sister, Aaron Blankenship. Per IVC: " The petitioner states the respondent has not been eating, sleeping or tending to his personal hygiene. The respondent has recently threatened to kill himself by putting a knife to his throat. The respondent has conversations with himself as if speaking to someone else. The respondent  verbally threatens to kill himself then uses bad words toward mother. In addition, there respondent punches himself, spits on and slaps himself. The respondent abuse illegal drugs, fentanyl and unprescribed drugs Xanax almost every day."  Per Triage assessment, Pt denied SI, past suicide attempts, HI, NSSH, AVH and paranoia. Pt reported daily use of Fentanyl and Xanax (pt denied any Xanax use for the last 2-3 weeks.) Pt also reported occasional use of alcohol (every other month) and methamphetamine use weekly with last use 2 days ago. Pt denied any current OP psych providers. ? ?Pt stated he  sleeps about 2-3 hours per night. Per sister, pt eats something daily but it usually consists of candy or sweets. ? ?Pt seemed drowsy and not fully oriented. Pt was casually dressed and seemed adequately groomed. Pt was calm and cooperative and able to answer all questions. Pt's mood seemed depressed and flat affect seemed congruent. Pt's judgment and insight seemed impaired to poor. Pt did not appeared to be responding to internal stimuli or experiencing delusional thinking. ? ?COLLATERAL INFORMATION:  ?Per petitioner Aaron Blankenship, pt's sister, in addition to the information on the IVC narrative, sister stated that: ?Pt overdosed on Xanax about a month ago and was revived by LE. He was not taken in for any medical treatment at that time.  ?Pt has been awake since yesterday with no sleep and often does not sleep.  ?Pt eats daily but only eats candy and/or sweets. ?Pt recently threatened to kill himself and has before and put a knife to his throat. Pt has never actually attempted to kill himself to petitioner's knowledge or knowledge of their mother. (Pt denied any suicide attempts.) ?Pt appears to be responding to internal stimuli just after using drugs per sister. The last time she observed this behavior (talking to himself or someone who was not there) was last night.  ?Pt has also been observed to punch, spit on and slap himself just after using drugs.  The last time he was observed with this behavior was yesterday.  ?Pt has threatened to kill his mother multiple times. Per sister, pt has never actually hurt anyone in the past.  ? ? ? ?Chief Complaint:  ?Chief Complaint  ?Patient presents with  ?  Polysubstance Abuse  ? ?Visit Diagnosis:  ?Substance-induced psychosis ?Substance-induced mood d/o  ? ? ?CCA Screening, Triage and Referral (STR) ? ?Patient Reported Information ?How did you hear about [place]? Legal System ? ?What Is the Reason for Your Visit/Call Today? Aaron Blankenship is a 31 yo male who presented under IVC  brought in by the [employer] and BHRT. Pt was IVC'd by his sister, Aaron Blankenship. Per IVC: " The petitioner states the respondent has not been eating, sleeping or tending to his personal hygiene. The respondent has recently threatened to kill himself by putting a knife to his throat. The respondent has conversations with himself as if speaking to someone else. The respondent  verbally threatens to kill himself then uses bad words toward mother. In addition, there respondent punches himself, spits on and slaps himself. The respondent abuse illegal drugs, fentanyl and unprescribed drugs Xanax almost every day."  Per Triage assessment, Pt denied SI, past suicide attempts, HI, NSSH, AVH and paranoia. Pt reported daily use of Fentanyl and Xanax (pt denied any Xanax use for the last 2-3 weeks.) Pt also reported occasional use of alcohol (every other month) and methamphetamine use weekly with last use 2 days ago. Pt denied any current OP psych providers. ? ?How Long Has This Been Causing You Problems? > than 6 months ? ?What Do You Feel Would Help You the Most Today? Alcohol or Drug Use Treatment ? ? ?Have You Recently Had Any Thoughts About Hurting Yourself? No ? ?Are You Planning to Commit Suicide/Harm Yourself At This time? No ? ? ?Have you Recently Had Thoughts About Aaron Blankenship? No ? ?Are You Planning to Harm Someone at This Time? No ? ?Explanation: No data recorded ? ?Have You Used Any Alcohol or Drugs in the Past 24 Hours? Yes ? ?How Long Ago Did You Use Drugs or Alcohol? No data recorded ?What Did You Use and How Much? 1/2 gram of heroin ? ? ?Do You Currently Have a Therapist/Psychiatrist? No ? ?Name of Therapist/Psychiatrist: No data recorded ? ?Have You Been Recently Discharged From Any Office Practice or Programs? No data recorded ?Explanation of Discharge From Practice/Program: No data recorded ? ?  ?CCA Screening Triage Referral Assessment ?Type of Contact: Face-to-Face ? ?Telemedicine Service Delivery:    ?Is this Initial or Reassessment? No data recorded ?Date Telepsych consult ordered in [facility]:  No data recorded ?Time Telepsych consult ordered in [facility]:  No data recorded ?Location of Assessment: [facility] Assessment Services ? ?Provider Location: [facility] Assessment Services ? ? ?Collateral Involvement: NP and this clinician called sister, Stephanie/petitioner, for collateral information and further explanation of the IVC. Colletta [place] translated for mother to participate too. ? ? ?Does Patient Have a Stage manager Guardian? No data recorded ?Name and Contact of Legal Guardian: No data recorded ?If Minor and Not Living with Parent(s), Who has Custody? No data recorded ?Is CPS involved or ever been involved? -- [place] Badder) ? ?Is APS involved or ever been involved? -- [place] Badder) ? ? ?Patient Determined To Be At Risk for Harm To Self or Others Based on Review of Patient Reported Information or Presenting Complaint? Yes, for Self-Harm ? ?Method: No data recorded ?Availability of Means: No data recorded ?Intent: No data recorded ?Notification Required: No data recorded ?Additional Information for Danger to Others Potential: No data recorded ?Additional Comments for Danger to Others Potential: No data recorded ?Are There Guns or Other Weapons in [place]? No data recorded ?Types of Guns/Weapons: No data recorded ?Are These Weapons  Safely Secured?                            No data recorded ?Who Could Verify You Are Able To Have These Secured: No data recorded ?Do You Have any Outstanding Charges, Pending Court Dates, Parole/Probation? No data recorded ?Contacted To Inform of Risk of Harm To Self or Others: No data recorded ? ? ?Does Patient Present under Involuntary Commitment? Yes ? ?IVC Papers Initial File Date: 04/19/21 ? ? ?[place] of Residence: Kathleen Argue ? ? ?Patient Currently Receiving the Following Services: No data recorded ? ?Determination of Need: Urgent (48 hours) ? ? ?Options For Referral: [facility] (Per Aaron Lolling NP, pt is recommended for overnight observation in the [facility] OBS unit and re-assessment tomorrow.) ? ? ? ? ?CCA Biopsychosocial ?Patient Reported Schizophrenia/Schizoaffective Diagnosis in Past: No ?

## 2021-04-19 NOTE — ED Notes (Signed)
Patient complains of anxiety - denies SI, HI or AVH at this time - snack given - patient requesting medication for anxiety.  Will continue to monitor for safety ?

## 2021-04-19 NOTE — ED Notes (Signed)
Patient encouraged to give a urine sample - cup at bedside ?

## 2021-04-19 NOTE — ED Notes (Signed)
Pt had sneak ?

## 2021-04-20 LAB — URINALYSIS, COMPLETE (UACMP) WITH MICROSCOPIC
Bacteria, UA: NONE SEEN
Bilirubin Urine: NEGATIVE
Glucose, UA: NEGATIVE mg/dL
Hgb urine dipstick: NEGATIVE
Ketones, ur: NEGATIVE mg/dL
Nitrite: NEGATIVE
Protein, ur: NEGATIVE mg/dL
Specific Gravity, Urine: 1.017 (ref 1.005–1.030)
pH: 8 (ref 5.0–8.0)

## 2021-04-20 LAB — RPR: RPR Ser Ql: NONREACTIVE

## 2021-04-20 LAB — POCT URINE DRUG SCREEN - MANUAL ENTRY (I-SCREEN)
POC Amphetamine UR: NOT DETECTED
POC Buprenorphine (BUP): NOT DETECTED
POC Cocaine UR: NOT DETECTED
POC Marijuana UR: POSITIVE — AB
POC Methadone UR: NOT DETECTED
POC Methamphetamine UR: POSITIVE — AB
POC Morphine: NOT DETECTED
POC Oxazepam (BZO): POSITIVE — AB
POC Oxycodone UR: NOT DETECTED
POC Secobarbital (BAR): NOT DETECTED

## 2021-04-20 NOTE — ED Notes (Signed)
Pt is awake and alert.  He has been complaining of opiate withdrawal.  Pt given PRN medications as ordered. Given juice and fluids to drink.    Denies SI, HI or AVH.  Will continue to assess.   ?

## 2021-04-20 NOTE — ED Notes (Signed)
Pt given anxiety medication. He was informed that he would not be admitted to [facility].  He is currently calling his mother to come pick him up.  ?

## 2021-04-20 NOTE — BH Assessment (Signed)
BHH Assessment Progress Note ?  ?Per Doran Heater, NP, this pt does not require psychiatric hospitalization at this time.  Pt is psychiatrically cleared.  Discharge instructions include referral information for [facility] for pt's mental health needs, as well as area opioid replacement therapy providers, and supportive services for the homeless.  Inetta Fermo and pt's nurse, Minerva Areola, have been notified. ? ?Doylene Canning, MA ?Triage Specialist ?[phone] ? ?

## 2021-04-20 NOTE — ED Notes (Signed)
Patient received snack.  

## 2021-04-20 NOTE — ED Provider Notes (Signed)
FBC/OBS ASAP Discharge Summary ? ?Date and Time: 04/20/2021 11:38 AM  ?Name: Aaron Blankenship  ?MRN:  [id number]  ? ?Discharge Diagnoses:  ?Final diagnoses:  ?Polysubstance abuse ([place])  ?Involuntary commitment  ? ? ?Subjective: Patient states "I will only go somewhere that will give me Subutex."  Patient reports he is not willing to enter substance use treatment if Subutex not offered, he would prefer to discharge home.  Patient is insightful regarding diagnosis and treatment options. ? ?Aaron Blankenship reports recent stressors include upcoming court related to his substance use on 04/23/2021.  Verbalizes plan to attend court then may consider outpatient substance use treatment options. ? ?Aaron Blankenship has been diagnosed with anxiety and depression along with substance use disorder.  No current medication management.  He is not currently linked with outpatient psychiatry.  No family mental health history reported.  He does endorse one recent inpatient psychiatric admission related to substance use. ? ?Patient is reclined in observation area upon my approach, appears asleep.  Patient is easily awakened.  Patient is alert and oriented, pleasant and cooperative during assessment.  He is reassessed, face-to-face by nurse practitioner. ? ?Patient presents voluntarily to [facility] behavioral health for walk-in assessment.  He presents with euthymic mood, congruent affect. He denies suicidal and homicidal ideations.  He denies history of suicide attempts, denies history of self-harm.  He contracts verbally for safety with this Probation officer. ?  He has normal speech and behavior.  He denies  auditory and visual hallucinations.  Patient is able to converse coherently with goal-directed thoughts and no distractibility or preoccupation.  He denies paranoia.  Objectively there is no evidence of psychosis/mania or delusional thinking. ? ?Patient endorses average sleep and appetite. ?Aaron Blankenship resides in [place] with his mother, he denies access to  weapons.  He is not currently employed.  He endorses substance use including fentanyl daily and methamphetamine several times per week.  At this time he is unwilling to seek residential substance use treatment.  He will follow-up with substance use treatment options and is actively seeking residential substance use including Subutex. ? ?Patient offered support and encouragement.  He gives verbal consent to speak with his mother, Aaron Blankenship phone number [phone].  Spoke with patient's mother who requires Spanish language interpreter.  Spanish language interpreter Leda Quail interpreter [phone] assisted with conversation.  Patient's mother reports concern that patient will continue to abuse substances.  Patient's mother reports she would prefer that he not return to her home however she states she has made him aware that he is no longer welcome in her home and he "will come back because he has nowhere else to go." ? ?Patient's mother offered support and encouragement, verbalizes understanding of treatment plan and agrees with plan to offer shelter options at this time. ? ?Patient's mother is educated and verbalizes understanding of mental health resources and other crisis services in the community. She is instructed to call 911 and present to the nearest emergency room should he experience any suicidal/homicidal ideation, auditory/visual/hallucinations, or detrimental worsening of his mental health condition.   ? ?Stay Summary: HPI from 04/19/2021 at 1724pm: patient presented to [facility] via [facility] under involuntary commitment.  ?  ?Aaron Blankenship, 31 y.o., male patient seen face to face by this provider, consulted with Dr. Serafina Mitchell; and chart reviewed on 04/19/21.  Per chart review patient was admitted to [facility] for Opioid Detox from 03/15/2021-3/9/203 he was prescribed subutex at that time. He was seen at [facility] on 02/14/2021 and [facility]  on 02/13/2021 related to opioid use. He was seen at [facility] on  02/05/2021 requested opioid detox be declined due to suboxone/subutex not being offered.  ?  ?Per IVC findings: Petitioner is Aaron Blankenship, patients sister [phone]. The petitioner states: "The respondent has not been eating, sleeping or attending to his personal hygiene.  The respondent has recently threatened to kill himself by putting a knife to his throat.  The respondent has conversations with himself as if he was speaking to someone else.  The respondent verbally threatens to kill himself then uses backwards towards my mother.  In addition the respondent punches himself, spits on and slaps himself.  The respondent abuses illegal drugs, fentanyl, and unprescribed drugs Xanax almost every day". ? ?Patient reports he does not currently have any psychiatric services in place. He does not take any medications.  Patient reports he has been snorting 1 mg Fentanyl daily for roughly 3 years, last use was last night.  He also uses methamphetamines weekly, last use 2 days ago. He endorses occasional alcohol use. He endorses xanax use but not in the past 2-3 weeks. At this time he is endorsing body aches.  He denies other symptoms of withdrawals including shakes, chills, and restlessness.  He has participated in detox programs and MAT in the past.  ?  ?During evaluation Aaron Blankenship is alert/oriented x 3 and cooperative.  He is disheveled and makes no eye contact.  He is drowsy and irritable. He answers questions with short answers.  He denies all the findings in the IVC.  He has a depressed flat affect.  Pateint denies SI/HI/AVH.  Objectively he does not appear to be responding to internal/external stimuli.  He denies paranoid and delusional thought content.  He denies any past suicide attempts.  Reports he only sleeps 2-3 hours per night and has a decreased appetite.  ?  ?  ?Per Faylene Kurtz, Counselor note (this writer was present while collateral was being obtained) ?  ?COLLATERAL INFORMATION: ?Per  petitioner [name], pt's sister, in addition to the information on the IVC narrative, sister stated that: ?Pt overdosed on Xanax about a month ago and was revived by LE. He was not taken in for any medical treatment at that time.  ?Pt has been awake since yesterday with no sleep and often does not sleep.  ?Pt eats daily but only eats candy and/or sweets. ?Pt recently threatened to kill himself and has before and put a knife to his throat. Pt has never actually attempted to kill himself to petitioner's knowledge or knowledge of their mother. (Pt denied any suicide attempts.) ?Pt appears to be responding to internal stimuli just after using drugs per sister. The last time she observed this behavior (talking to himself or someone who was not there) was last night.  ?Pt has also been observed to punch, spit on and slap himself just after using drugs.  The last time he was observed with this behavior was yesterday.  ?Pt has threatened to kill his mother multiple times. Per sister, pt has never actually hurt anyone in the past ?  ?Discussed being admitted to the continuous assessment unit for overnight observation.  Explained the milieu and expectations including COVID testing, lab work, and EKG.  Patient agreed.  Patient is requesting detox from opioids.  Explained that Subutex is not offered at this facility.  Patient verbalized understanding. ?  ? ? ?Total Time spent with patient: 30 minutes ? ?Past Psychiatric History: Anxiety, depression, substance use disorder ?  Past Medical History:  ?Past Medical History:  ?Diagnosis Date  ? Anxiety   ? with panic disorder  ? Asthma   ? Depression   ? Heroin abuse ([place])   ? No past surgical history on file. ?Family History:  ?Family History  ?Problem Relation Age of Onset  ? Diabetes Mother   ? Depression Mother   ? Depression Sister   ? ?Family Psychiatric History: mother- depression ?Social History:  ?Social History  ? ?Substance and Sexual Activity  ?Alcohol Use Not  Currently  ? Alcohol/week: 0.0 standard drinks  ? Comment: Abused in past--nothing since 2018  ?   ?Social History  ? ?Substance and Sexual Activity  ?Drug Use Yes  ? Types: Marijuana, Heroin  ? Comment: fentanyl  ?

## 2021-04-20 NOTE — Discharge Instructions (Addendum)
For your mental health needs you are advised to follow up with [facility] at your earliest opportunity: ?     [facility] ?     [address] ?     [place], [place] [address] ?     [phone] ?     They offer psychiatry/medication management and therapy.  New patients are seen in their walk-in clinic.  Walk-in hours are Monday, Wednesday, Thursday and Friday from 8:00 am - 11:00 am for psychiatry, and Monday and Wednesday from 8:00 am - 11:00 am for therapy.  Walk-in patients are seen on a first come, first served basis, so try to arrive as early as possible for the best chance of being seen the same day.  Please note that to be eligible for services you must bring an ID or a piece of mail with your name and a [facility] address. ? ?For opioid replacement therapy contact one of the provides listed below: ? ?     [facility] ?     [address] ?     [place], [place] [address] ?     [phone] ? ?     [facility] [employer] ?     [address] ?     [place], [place] [address] ?     [phone]  ? ?For your shelter needs, contact the following service providers: ? ?     [facility] ?     [address] ?     [place], [place] [address] ?     [phone] ? ?     [facility] ?     [address] ?     [place], [place] [address] ?     [phone] ? ?For day shelter and other supportive services for the homeless, contact the [facility]): ? ?     [facility] ?     [address] ?     [place], [place] [address] ?     [phone] ? ?For transitional housing, Pension scheme manager.  They provide longer term housing than a shelter, but there is an application process: ? ?     [facility] ?     [facility] ?     [address] ?     [place], [place] [address] ?     [phone]  ? ?Patient is instructed prior to discharge  to: ? Take all medications as prescribed by his/her mental healthcare provider. ?Report any adverse effects and or reactions from the medicines to his/her outpatient provider promptly. ?Keep all scheduled appointments, to ensure that you are getting refills on time and to avoid any interruption in your medication.  If you are unable to keep an appointment call to reschedule.  Be sure to follow-up with resources and follow-up appointments provided.  ?Patient has been instructed & cautioned: To not engage in alcohol and or illegal drug use while on prescription medicines. ?In the event of worsening symptoms, patient is instructed to call the crisis hotline, 911 and or go to the nearest ED for appropriate evaluation and treatment of symptoms. ?To follow-up with his/her primary care provider for your other medical issues, concerns and or health care needs. ?  ? ?

## 2021-04-20 NOTE — ED Notes (Signed)
Patient resting with no sxs of distress - will continue to monitor for safety 

## 2021-04-20 NOTE — ED Notes (Signed)
Called mother and left a voice mail, asking when she is going to pick pt up to take him home. Mother has not responded. [phone]. ?

## 2021-04-21 LAB — GC/CHLAMYDIA PROBE AMP (~~LOC~~) NOT AT ARMC
Chlamydia: NEGATIVE
Comment: NEGATIVE
Comment: NORMAL
Neisseria Gonorrhea: NEGATIVE

## 2022-05-13 IMAGING — CR DG CHEST 2V
2 series · 2 of 2 positions shown · non-contrast
Comparison: 07/18/2019

CLINICAL DATA: Dyspnea

EXAM:
CHEST - 2 VIEW

[chest pa]
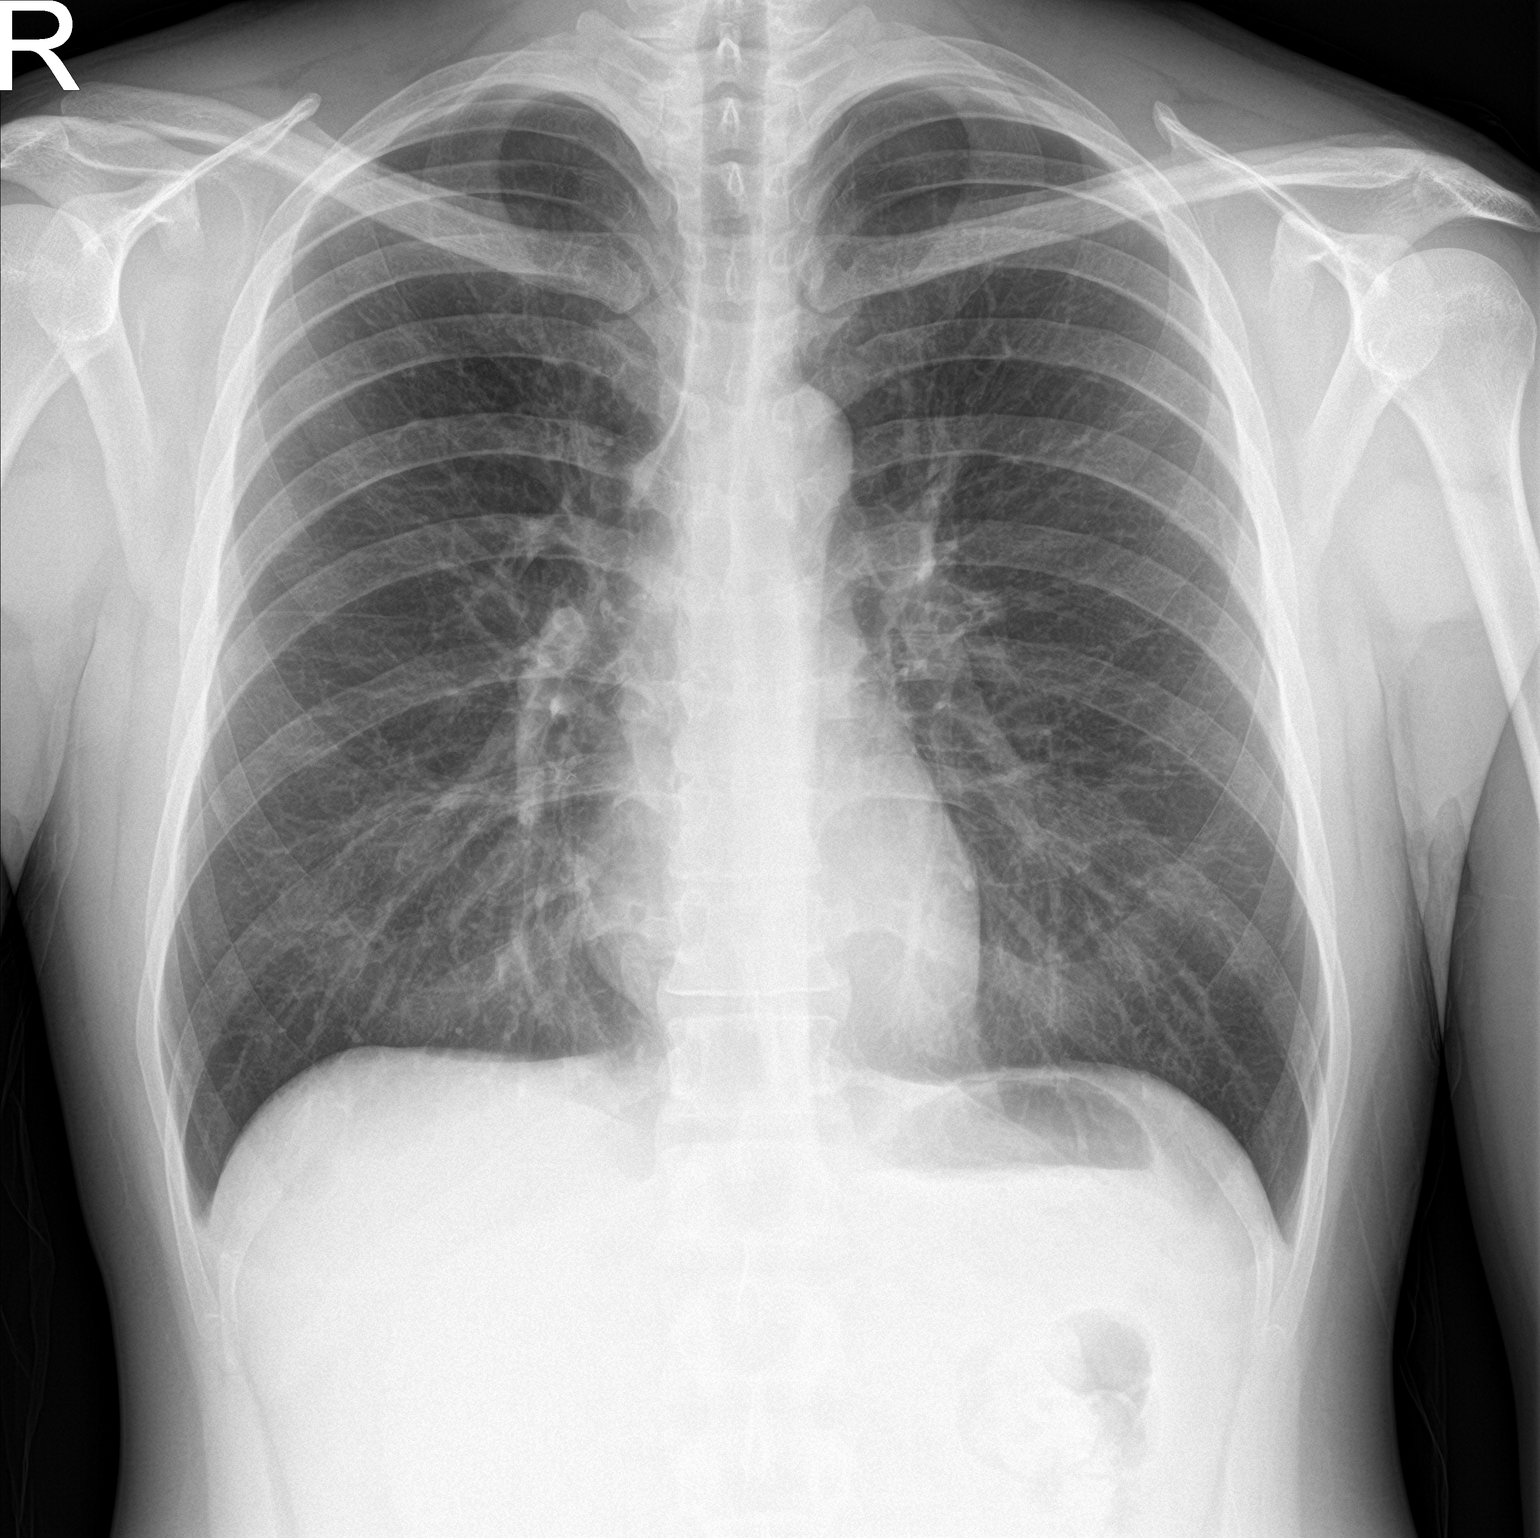

[chest lat]
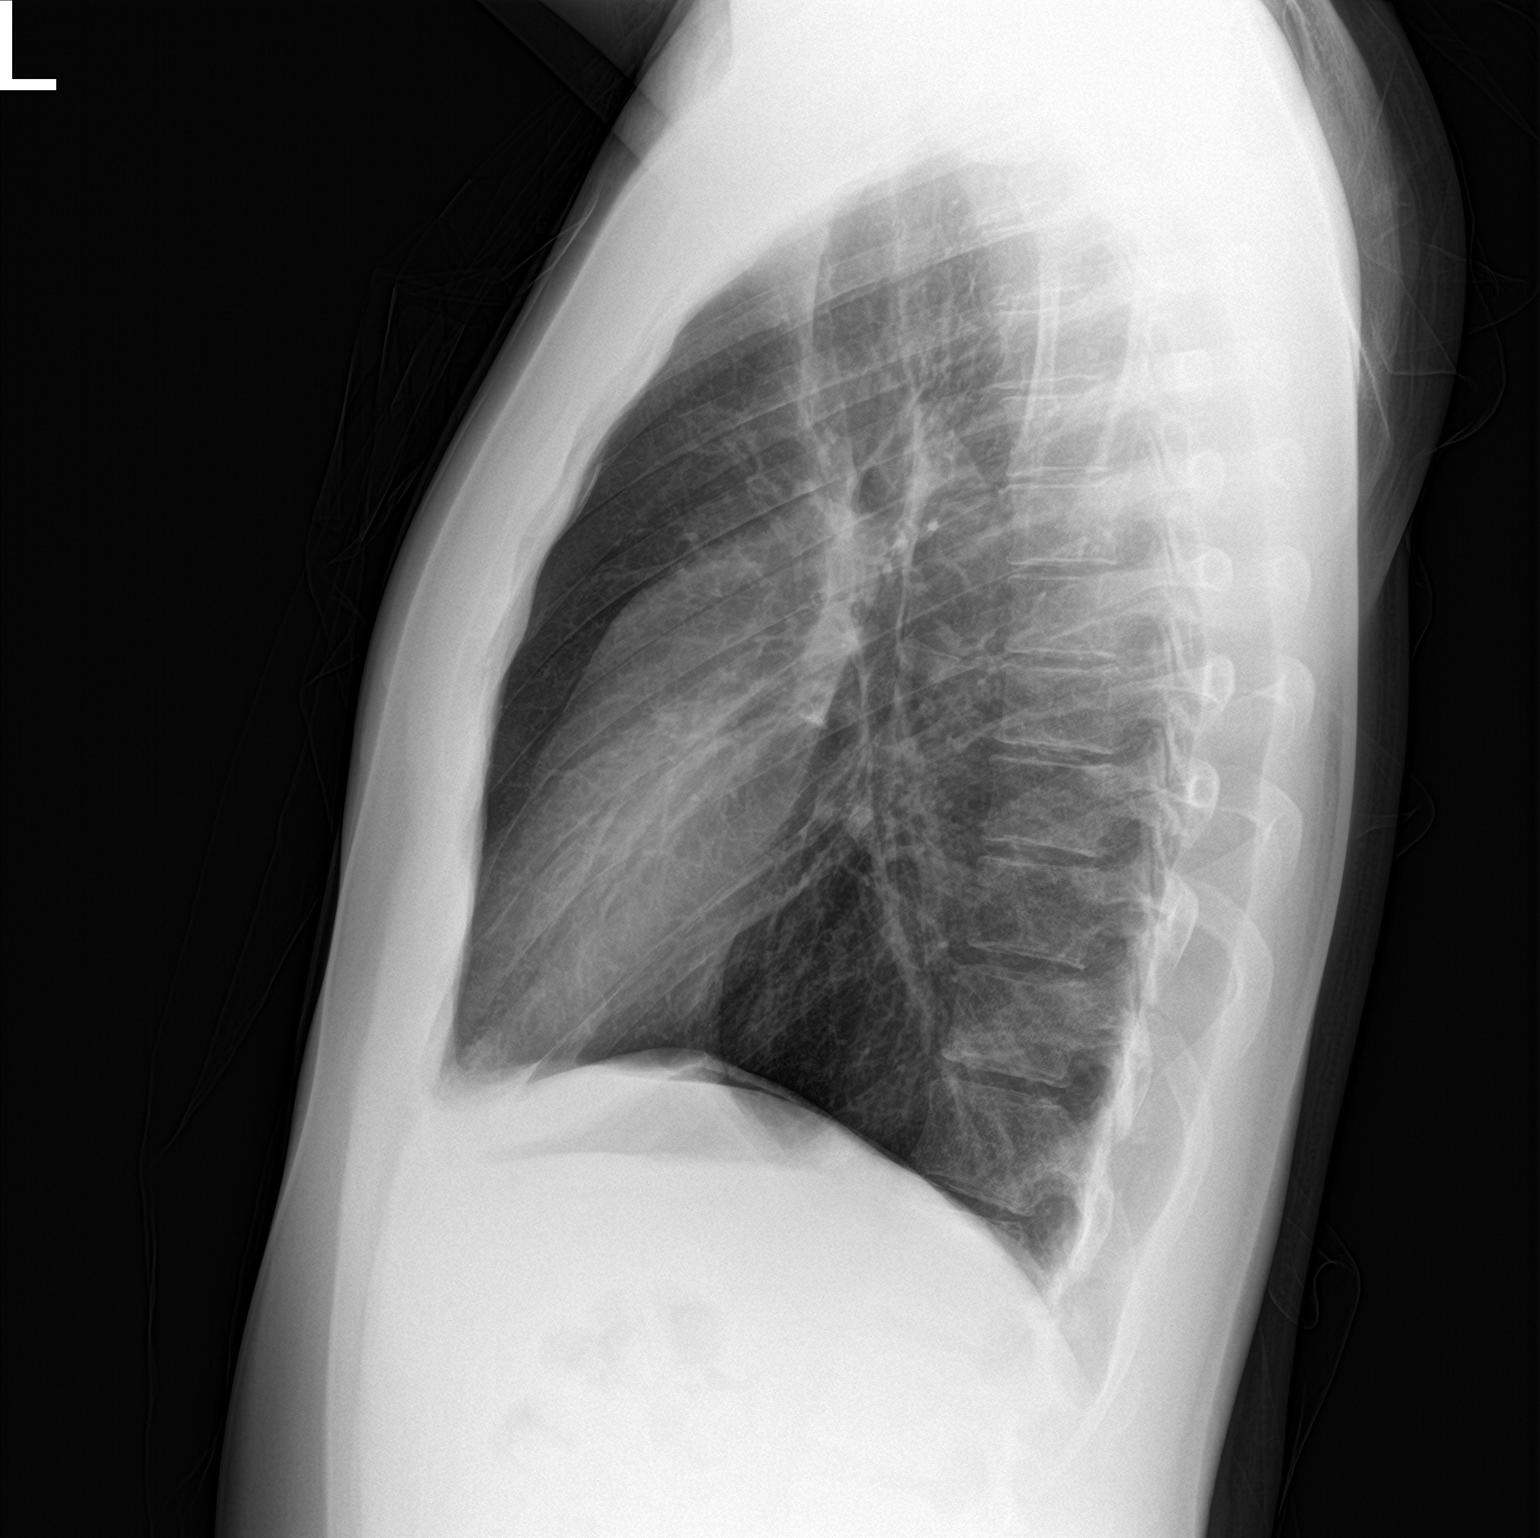

[2 of 2 positions shown; findings below may reference images not displayed]

FINDINGS: The heart size and mediastinal contours are within normal limits.
Both lungs are clear. The visualized skeletal structures are
unremarkable.
IMPRESSION: No active cardiopulmonary disease.

## 2023-05-20 ENCOUNTER — Emergency Department (HOSPITAL_COMMUNITY): Payer: Self-pay

## 2023-05-20 ENCOUNTER — Observation Stay (HOSPITAL_COMMUNITY)
Admission: EM | Admit: 2023-05-20 | Discharge: 2023-05-21 | Disposition: A | Discharge status: Home / Self Care | Payer: Self-pay | Attending: Infectious Diseases | Admitting: Infectious Diseases

## 2023-05-20 ENCOUNTER — Observation Stay (HOSPITAL_COMMUNITY): Payer: Self-pay

## 2023-05-20 ENCOUNTER — Encounter (HOSPITAL_COMMUNITY): Payer: Self-pay | Admitting: Infectious Diseases

## 2023-05-20 ENCOUNTER — Other Ambulatory Visit: Payer: Self-pay

## 2023-05-20 DIAGNOSIS — R0602 Shortness of breath: Secondary | ICD-10-CM | POA: Insufficient documentation

## 2023-05-20 DIAGNOSIS — J811 Chronic pulmonary edema: Secondary | ICD-10-CM | POA: Insufficient documentation

## 2023-05-20 DIAGNOSIS — R739 Hyperglycemia, unspecified: Secondary | ICD-10-CM

## 2023-05-20 DIAGNOSIS — T40601A Poisoning by unspecified narcotics, accidental (unintentional), initial encounter: Principal | ICD-10-CM

## 2023-05-20 DIAGNOSIS — R079 Chest pain, unspecified: Secondary | ICD-10-CM | POA: Insufficient documentation

## 2023-05-20 DIAGNOSIS — Z8659 Personal history of other mental and behavioral disorders: Secondary | ICD-10-CM | POA: Insufficient documentation

## 2023-05-20 DIAGNOSIS — D72829 Elevated white blood cell count, unspecified: Secondary | ICD-10-CM | POA: Insufficient documentation

## 2023-05-20 DIAGNOSIS — F1910 Other psychoactive substance abuse, uncomplicated: Secondary | ICD-10-CM

## 2023-05-20 DIAGNOSIS — R7401 Elevation of levels of liver transaminase levels: Secondary | ICD-10-CM

## 2023-05-20 DIAGNOSIS — N289 Disorder of kidney and ureter, unspecified: Secondary | ICD-10-CM

## 2023-05-20 DIAGNOSIS — T50901A Poisoning by unspecified drugs, medicaments and biological substances, accidental (unintentional), initial encounter: Secondary | ICD-10-CM | POA: Diagnosis present

## 2023-05-20 DIAGNOSIS — T40411A Poisoning by fentanyl or fentanyl analogs, accidental (unintentional), initial encounter: Principal | ICD-10-CM | POA: Insufficient documentation

## 2023-05-20 DIAGNOSIS — I951 Orthostatic hypotension: Secondary | ICD-10-CM | POA: Insufficient documentation

## 2023-05-20 DIAGNOSIS — Z720 Tobacco use: Secondary | ICD-10-CM | POA: Insufficient documentation

## 2023-05-20 DIAGNOSIS — N179 Acute kidney failure, unspecified: Secondary | ICD-10-CM | POA: Insufficient documentation

## 2023-05-20 DIAGNOSIS — R809 Proteinuria, unspecified: Secondary | ICD-10-CM | POA: Insufficient documentation

## 2023-05-20 DIAGNOSIS — T405X1A Poisoning by cocaine, accidental (unintentional), initial encounter: Secondary | ICD-10-CM | POA: Insufficient documentation

## 2023-05-20 HISTORY — DX: Other psychoactive substance abuse, uncomplicated: F19.10

## 2023-05-20 LAB — CBC WITH DIFFERENTIAL/PLATELET
Abs Immature Granulocytes: 0.21 10*3/uL — ABNORMAL HIGH (ref 0.00–0.07)
Basophils Absolute: 0.1 10*3/uL (ref 0.0–0.1)
Basophils Relative: 0 %
Eosinophils Absolute: 0.1 10*3/uL (ref 0.0–0.5)
Eosinophils Relative: 0 %
HCT: 45.2 % (ref 39.0–52.0)
Hemoglobin: 14.4 g/dL (ref 13.0–17.0)
Immature Granulocytes: 1 %
Lymphocytes Relative: 9 %
Lymphs Abs: 2.7 10*3/uL (ref 0.7–4.0)
MCH: 28.9 pg (ref 26.0–34.0)
MCHC: 31.9 g/dL (ref 30.0–36.0)
MCV: 90.8 fL (ref 80.0–100.0)
Monocytes Absolute: 1.3 10*3/uL — ABNORMAL HIGH (ref 0.1–1.0)
Monocytes Relative: 5 %
Neutro Abs: 24.8 10*3/uL — ABNORMAL HIGH (ref 1.7–7.7)
Neutrophils Relative %: 85 %
Platelets: 210 10*3/uL (ref 150–400)
RBC: 4.98 MIL/uL (ref 4.22–5.81)
RDW: 13.6 % (ref 11.5–15.5)
WBC: 29.2 10*3/uL — ABNORMAL HIGH (ref 4.0–10.5)
nRBC: 0 % (ref 0.0–0.2)

## 2023-05-20 LAB — COMPREHENSIVE METABOLIC PANEL WITH GFR
ALT: 513 U/L — ABNORMAL HIGH (ref 0–44)
AST: 738 U/L — ABNORMAL HIGH (ref 15–41)
Albumin: 3.8 g/dL (ref 3.5–5.0)
Alkaline Phosphatase: 108 U/L (ref 38–126)
Anion gap: 19 — ABNORMAL HIGH (ref 5–15)
BUN: 10 mg/dL (ref 6–20)
CO2: 22 mmol/L (ref 22–32)
Calcium: 8.5 mg/dL — ABNORMAL LOW (ref 8.9–10.3)
Chloride: 95 mmol/L — ABNORMAL LOW (ref 98–111)
Creatinine, Ser: 2.02 mg/dL — ABNORMAL HIGH (ref 0.61–1.24)
GFR, Estimated: 44 mL/min — ABNORMAL LOW (ref >60)
Glucose, Bld: 216 mg/dL — ABNORMAL HIGH (ref 70–99)
Potassium: 3.9 mmol/L (ref 3.5–5.1)
Sodium: 136 mmol/L (ref 135–145)
Total Bilirubin: 0.8 mg/dL (ref 0.0–1.2)
Total Protein: 6.5 g/dL (ref 6.5–8.1)

## 2023-05-20 LAB — RESP PANEL BY RT-PCR (RSV, FLU A&B, COVID)  RVPGX2
Influenza A by PCR: NEGATIVE
Influenza B by PCR: NEGATIVE
Resp Syncytial Virus by PCR: NEGATIVE
SARS Coronavirus 2 by RT PCR: NEGATIVE

## 2023-05-20 LAB — SALICYLATE LEVEL: Salicylate Lvl: <7 mg/dL — ABNORMAL LOW (ref 7.0–30.0)

## 2023-05-20 LAB — RAPID URINE DRUG SCREEN, HOSP PERFORMED
Amphetamines: POSITIVE — AB
Barbiturates: NOT DETECTED
Benzodiazepines: POSITIVE — AB
Cocaine: POSITIVE — AB
Opiates: NOT DETECTED
Tetrahydrocannabinol: NOT DETECTED

## 2023-05-20 LAB — LIPASE, BLOOD: Lipase: 25 U/L (ref 11–51)

## 2023-05-20 LAB — I-STAT CHEM 8, ED
BUN: 10 mg/dL (ref 6–20)
Calcium, Ion: 1.07 mmol/L — ABNORMAL LOW (ref 1.15–1.40)
Chloride: 100 mmol/L (ref 98–111)
Creatinine, Ser: 1.4 mg/dL — ABNORMAL HIGH (ref 0.61–1.24)
Glucose, Bld: 85 mg/dL (ref 70–99)
HCT: 37 % — ABNORMAL LOW (ref 39.0–52.0)
Hemoglobin: 12.6 g/dL — ABNORMAL LOW (ref 13.0–17.0)
Potassium: 4 mmol/L (ref 3.5–5.1)
Sodium: 140 mmol/L (ref 135–145)
TCO2: 27 mmol/L (ref 22–32)

## 2023-05-20 LAB — URINALYSIS, ROUTINE W REFLEX MICROSCOPIC
Bilirubin Urine: NEGATIVE
Glucose, UA: 50 mg/dL — AB
Hgb urine dipstick: NEGATIVE
Ketones, ur: NEGATIVE mg/dL
Nitrite: NEGATIVE
Protein, ur: 30 mg/dL — AB
Specific Gravity, Urine: 1.005 (ref 1.005–1.030)
pH: 7 (ref 5.0–8.0)

## 2023-05-20 LAB — I-STAT CG4 LACTIC ACID, ED
Lactic Acid, Venous: 2.6 mmol/L (ref 0.5–1.9)
Lactic Acid, Venous: 1.8 mmol/L (ref 0.5–1.9)

## 2023-05-20 LAB — HEPATITIS PANEL, ACUTE
HCV Ab: NONREACTIVE
Hep A IgM: NONREACTIVE
Hep B C IgM: NONREACTIVE
Hepatitis B Surface Ag: NONREACTIVE

## 2023-05-20 LAB — CK: Total CK: 238 U/L (ref 49–397)

## 2023-05-20 LAB — ETHANOL: Alcohol, Ethyl (B): <15 mg/dL (ref <15)

## 2023-05-20 LAB — HIV ANTIBODY (ROUTINE TESTING W REFLEX): HIV Screen 4th Generation wRfx: NONREACTIVE

## 2023-05-20 LAB — HEPATITIS B SURFACE ANTIBODY,QUALITATIVE: Hep B S Ab: NONREACTIVE

## 2023-05-20 LAB — ACETAMINOPHEN LEVEL: Acetaminophen (Tylenol), Serum: <10 ug/mL — ABNORMAL LOW (ref 10–30)

## 2023-05-20 LAB — OSMOLALITY: Osmolality: 286 mosm/kg (ref 275–295)

## 2023-05-20 MED ORDER — NALOXONE HCL 2 MG/2ML IJ SOSY
2.0000 mg | PREFILLED_SYRINGE | Freq: Once | INTRAMUSCULAR | Status: AC
  Administered 2023-05-20: 2 mg via INTRAMUSCULAR

## 2023-05-20 MED ORDER — PANTOPRAZOLE SODIUM 40 MG PO TBEC
40.0000 mg | DELAYED_RELEASE_TABLET | Freq: Every day | ORAL | Status: DC
  Administered 2023-05-20 – 2023-05-21 (×2): 40 mg via ORAL
  Filled 2023-05-20 (×2): qty 1

## 2023-05-20 MED ORDER — ADULT MULTIVITAMIN W/MINERALS CH
1.0000 | ORAL_TABLET | Freq: Every day | ORAL | Status: DC
  Administered 2023-05-20 – 2023-05-21 (×2): 1 via ORAL
  Filled 2023-05-20 (×2): qty 1

## 2023-05-20 MED ORDER — SODIUM CHLORIDE 0.9 % IV BOLUS
1000.0000 mL | Freq: Once | INTRAVENOUS | Status: AC
  Administered 2023-05-20: 1000 mL via INTRAVENOUS

## 2023-05-20 MED ORDER — NALOXONE HCL 0.4 MG/ML IJ SOLN: 0.4000 mg | INTRAMUSCULAR | Status: DC | PRN

## 2023-05-20 MED ORDER — SODIUM CHLORIDE 0.9% FLUSH
3.0000 mL | Freq: Two times a day (BID) | INTRAVENOUS | Status: DC
  Administered 2023-05-20 – 2023-05-21 (×2): 3 mL via INTRAVENOUS

## 2023-05-20 MED ORDER — ACETAMINOPHEN 500 MG PO TABS
1000.0000 mg | ORAL_TABLET | Freq: Three times a day (TID) | ORAL | Status: DC
  Administered 2023-05-20: 1000 mg via ORAL
  Filled 2023-05-20: qty 2

## 2023-05-20 MED ORDER — ALBUTEROL SULFATE (2.5 MG/3ML) 0.083% IN NEBU: 3.0000 mL | INHALATION_SOLUTION | RESPIRATORY_TRACT | Status: DC | PRN

## 2023-05-20 MED ORDER — THIAMINE HCL 100 MG/ML IJ SOLN: 100.0000 mg | Freq: Every day | INTRAMUSCULAR | Status: DC

## 2023-05-20 MED ORDER — LACTATED RINGERS IV BOLUS
1000.0000 mL | Freq: Once | INTRAVENOUS | Status: AC
  Administered 2023-05-20: 1000 mL via INTRAVENOUS

## 2023-05-20 MED ORDER — HYDROXYZINE HCL 25 MG PO TABS
25.0000 mg | ORAL_TABLET | Freq: Three times a day (TID) | ORAL | Status: DC | PRN
  Administered 2023-05-20: 25 mg via ORAL
  Filled 2023-05-20: qty 1

## 2023-05-20 MED ORDER — FAMOTIDINE 20 MG PO TABS
20.0000 mg | ORAL_TABLET | Freq: Once | ORAL | Status: AC
  Administered 2023-05-20: 20 mg via ORAL
  Filled 2023-05-20: qty 1

## 2023-05-20 MED ORDER — ENOXAPARIN SODIUM 40 MG/0.4ML IJ SOSY
40.0000 mg | PREFILLED_SYRINGE | INTRAMUSCULAR | Status: DC
  Administered 2023-05-20: 40 mg via SUBCUTANEOUS
  Filled 2023-05-20: qty 0.4

## 2023-05-20 MED ORDER — ALUM & MAG HYDROXIDE-SIMETH 200-200-20 MG/5ML PO SUSP
30.0000 mL | Freq: Once | ORAL | Status: AC
  Administered 2023-05-20: 30 mL via ORAL
  Filled 2023-05-20: qty 30

## 2023-05-20 MED ORDER — ONDANSETRON HCL 4 MG/2ML IJ SOLN
4.0000 mg | Freq: Once | INTRAMUSCULAR | Status: AC
  Administered 2023-05-20: 4 mg via INTRAVENOUS

## 2023-05-20 MED ORDER — THIAMINE MONONITRATE 100 MG PO TABS
100.0000 mg | ORAL_TABLET | Freq: Every day | ORAL | Status: DC
  Administered 2023-05-20 – 2023-05-21 (×2): 100 mg via ORAL
  Filled 2023-05-20 (×2): qty 1

## 2023-05-20 MED ORDER — LORAZEPAM 1 MG PO TABS
2.0000 mg | ORAL_TABLET | Freq: Once | ORAL | Status: AC
  Administered 2023-05-20: 2 mg via ORAL
  Filled 2023-05-20: qty 2

## 2023-05-20 MED ORDER — NALOXONE HCL 2 MG/2ML IJ SOSY
2.0000 mg | PREFILLED_SYRINGE | Freq: Once | INTRAMUSCULAR | Status: AC
  Administered 2023-05-20: 2 mg via INTRAVENOUS

## 2023-05-20 MED ORDER — NICOTINE 14 MG/24HR TD PT24
14.0000 mg | MEDICATED_PATCH | Freq: Every day | TRANSDERMAL | Status: DC
  Administered 2023-05-20 – 2023-05-21 (×2): 14 mg via TRANSDERMAL
  Filled 2023-05-20 (×2): qty 1

## 2023-05-20 MED ORDER — FOLIC ACID 1 MG PO TABS
1.0000 mg | ORAL_TABLET | Freq: Every day | ORAL | Status: DC
Start: 2023-05-20 — End: 2023-05-21
  Administered 2023-05-20 – 2023-05-21 (×2): 1 mg via ORAL
  Filled 2023-05-20 (×2): qty 1

## 2023-05-20 NOTE — ED Notes (Signed)
 Once pt is discharged, may call Vinie [facility]land [phone] for ride. Halee states they live 10 minutes away.

## 2023-05-20 NOTE — Hospital Course (Addendum)
 Works in [employer]  Epigastric pain and v Chest pain, shortness of breath   Drug overdose Hypotension Polysubstance use disorder Alcohol use  Chronic benzodiazepine; last dose Xanax use daily off the street [ ] UDS - cocaine, benzo, amphetamines  AKI on CKD? Cr 2.0> 1.4  after fluid resuscitation in the ED Bacteriuria with pyuria - S/p 3L IVF  Elevated liver function [Gallbladder wall thickening. CK normal  [ ]  hepatitis C [ ]  Hepatitis B surface ab [ ] HIV [ ] Acalculus cholecystitis [ ]    Hypotension    Tachycardia  Leukocytosis  WBC to 29.2 with neutrophil predominance (24.8) [ ]  Blood cultures [ ] ?urine culture follow up if symptomatic [ ]  Fever curve [ ]  do not order tylenol   Elevated lactic acid Metabolic acidosis   Epigastric pain Nausea/vomiting EKG without signs of ischemia. Negative lipase.  [ ]  Troponin x2 [ ]  KUB [ ]   Proteinuria

## 2023-05-20 NOTE — ED Triage Notes (Signed)
 Pt arrives unresponsive with friend stating pt uses "anything they can get their hands on". Pt cyanotic, snoring respirations.

## 2023-05-20 NOTE — ED Provider Notes (Signed)
 Patient signed out to me at 0700 by Dr. Candelaria Chaco. In short this is a 33 year old male with a PMH of polysubstance use presenting to the ED with an overdose. Patient reported to use Fentanyl and UDS additionally positive for cocaine, benzos and amphetamines. Was initial hypoxic and unresponsive on arrival and did require narcan  x2. Now is awake on 4 L [place], hypotensive to the 70's currently receiving a 2nd L of IVF. Work up shows AKI with Cr 2.0 from baseline normal 2 years ago as well as transaminitis, negative Tylenol  level. At time of signs out, patient is awake and alert, ill appearing but in no acute distress. States he was vomiting yesterday with some epigastric pain and having some chest pain today. Did drink alcohol last night but states he only drinks on occasion. BP still in the 80's after 2nd liter of fluids and still appears dry and will be given additional fluids. Will add on CK, hepatitis panel, lactic and RUQ US . Will have EKG and be given GI cocktail and will be closely reassessed.  CRITICAL CARE Performed by: Nolberto Batty   Total critical care time: 35 minutes  Critical care time was exclusive of separately billable procedures and treating other patients.  Critical care was necessary to treat or prevent imminent or life-threatening deterioration.  Critical care was time spent personally by me on the following activities: development of treatment plan with patient and/or surrogate as well as nursing, discussions with consultants, evaluation of patient's response to treatment, examination of patient, obtaining history from patient or surrogate, ordering and performing treatments and interventions, ordering and review of laboratory studies, ordering and review of radiographic studies, pulse oximetry and re-evaluation of patient's condition.    Kingsley, Leveda Kendrix K, DO 05/20/23 1453

## 2023-05-20 NOTE — Progress Notes (Signed)
 Called to patient room due to concerns from MD and nurse that patient was possibly hiding substances in the room.  Had security conduct search of room and belongings.  Patient had already removed socks and had them in the bed with him.  One small plastic baggie with small amount of white powder was found in one of his socks.  This was disposed of properly per hospital policy.  Cigarettes and snuff were removed, placed in security bag and taken to security office.

## 2023-05-20 NOTE — ED Notes (Signed)
Dr. Preston Fleeting at bedside.

## 2023-05-20 NOTE — Progress Notes (Signed)
 Received signout on this patient at 7 PM and after discussing his benzodiazepine use we have elected to empirically treat for benzodiazepine withdrawal cautiously.  He reports a total daily dose of 4 mg of alprazolam which is roughly equal to 8 mg of lorazepam .  We will start with a 2 mg dose of oral lorazepam  and continue CIWA and COWS.  After receiving this dose the nurse reports that he is doing better. - S/p lorazepam  2 mg oral - Continue CIWA and COWS - If needed we will give additional doses of lorazepam , preferably orally - Please chat or page for any changes  Aaron Dallas, DO Internal Medicine Resident, PGY-2 Please contact the on call pager at [phone] for any urgent or emergent needs. 10:03 PM 05/20/2023

## 2023-05-20 NOTE — ED Notes (Signed)
 Radiology at bedside

## 2023-05-20 NOTE — ED Notes (Signed)
 Prescription bottle with what appears to be xanax bars and white powder in a baggie collected from pt and given to [employer] by Autumn RN.

## 2023-05-20 NOTE — ED Notes (Signed)
 Pt oriented to name only, admits to snorting fentanyl.

## 2023-05-20 NOTE — ED Notes (Signed)
 Patient transported to CT

## 2023-05-20 NOTE — ED Notes (Signed)
 Brother, [place], at bedside.

## 2023-05-20 NOTE — Plan of Care (Signed)

## 2023-05-20 NOTE — Progress Notes (Deleted)
 Date: 05/20/2023               Patient Name:  Aaron Blankenship MRN: [id number]  DOB: 08/26/1990 Age / Sex: 33 y.o., male   PCP: Pcp, No         Medical Service: Internal Medicine Teaching Service         Attending Physician: Dr. Sandie Cross, MD      First Contact: Dr. Sheree Dieter MD Pager [phone]    Second Contact: Dr. Cathey Clunes, MD          After Hours (After 5p/  First Contact Pager: [phone]  weekends / holidays): Second Contact Pager: [phone]   SUBJECTIVE   Chief Complaint: Nausea vomiting, overdose  History of Present Illness: This is a 34 year old gentleman with limited contact to the medical system presenting with nausea and vomiting following as well as drug overdose.  The patient states they have a long history of prior alcohol use but have not drink much in the last several months.  They also have a history of polysubstance abuse, but deny IV drug use.  The patient states that he drank 3-4 beers last night and began having black emesis.  This morning the patient used intranasal fentanyl, and was brought into the emergency department unresponsive.  They received Narcan  and IV fluid and there mental status and vitals quickly improved.  Overall the patient states that he has had epigastric pain ongoing for several months.  The pain is worse when he eats, and endorses emesis at times.  He states he had a chocolate cake this morning which may have caused his emesis to be black, but this is unclear.  He denies throwing up frank blood.  The patient does endorse ongoing polysubstance abuse.  He rarely drinks alcohol anymore but endorses prior history of significant alcohol use.  He states he has been using Xanax for the last 2 weeks p.o. at bedtime for sleep.  He does endorse chest pain and shortness of breath which has been ongoing for several months as well.  He endorses orthostatic lightheadedness and dizziness which has been going on for several months.  He  also does endorse urinary frequency.  Denies fevers, chills, unexpected weight loss.  Denies rashes, skin color changes, jaundice, recent tattoos, history of IV drug use, diarrhea, or hematochezia.  Denies recent use of NSAIDs or over-the-counter pain medications.  ED Course: Vitals: On presentation to the ED, the patient was apneic, hypotensive, tachycardic, and hypoxic in the 50s.  This has since improved with Narcan  and IV fluids. Labs: Ethanol negative, Tylenol  negative, salicylate negative.  UDS positive for cocaine, benzodiazepines, amphetamines.  CMP demonstrating elevated creatinine to 2.0, elevated AST 738, ALT 513, anion gap of 19 with normal bicarb.  CBC remarkable for neutrophil predominant leukocytosis, white blood cell count 29.2.  UA demonstrating glucose, protein, trace leukocytes and rare bacteria.  CK within normal limits.  Lipase negative.  Repeat chemistry panel demonstrating interval decrease in creatinine.  Lactic acid elevated at 2.6. Imaging: Chest x-ray showing increased pulmonary vascular congestion without frank edema.  Right upper quadrant ultrasound demonstrating no gallstones or pericholecystic fluid with negative sonographic Murphy sign.  There was gallbladder thickening to 4.7 millimeters. EKG: Sinus tachycardia.  QTc 450 In the ED: Patient received GI cocktail, Pepcid  20 mg, 2 mg Narcan  IM, 2 mg Narcan  IV, Zofran  4 mg IV, 3 L normal saline.   Meds:  No outpatient medications have been marked as taking for the 05/20/23  encounter [facility] Encounter).    Past Medical History Denies known past medical history.  Ongoing history of polysubstance abuse, prior alcohol use, tobacco use.  Denies known history of diabetes, cardiac pathology, GI pathology.  Social:  Occupation: Roofing PCP: Has not seen primary doctor in many years. Substances: Polysubstance use, denies IV drug use active or prior.  States he has significant past medical history of alcohol use, but rarely  drinks at this time.  Uses benzodiazepines for sleep nightly.  Significant tobacco use history, 20 years roughly 1 pack/day.  Family History: Significant for diabetes and multiple family members, age of onset usually in the 28s per patient.  Allergies: Allergies as of 05/20/2023   (No Known Allergies)    Review of Systems: Please see HPI and A&P for pertinent positives and negatives.   OBJECTIVE:   Physical Exam: Blood pressure 95/68, pulse (!) 105, temperature 98.3 F (36.8 C), temperature source Oral, resp. rate 11, SpO2 100%.  Constitutional: Somnolent, but otherwise alert and oriented.  No acute distress.   Cardiovascular: Tachycardic rate and rhythm, no m/r/g Pulmonary/Chest: normal work of breathing on room air, lungs clear to auscultation bilaterally Abdominal: Focal tenderness of the epigastrium and right upper quadrant.  Significant tenderness to palpation of the left lower quadrant.  No rebound, guarding, signs of peritonitis. Extremities: No lower extremity edema appreciated bilaterally Neurological: alert & oriented Skin: warm and dry.  Decreased skin turgor Psych: Pleasant affect, concerned mood Of note, some concern that patient may be hiding personal belongings on person  Labs: CBC    Component Value Date/Time   WBC 29.2 (H) 05/20/2023 0445   RBC 4.98 05/20/2023 0445   HGB 12.6 (L) 05/20/2023 0746   HCT 37.0 (L) 05/20/2023 0746   PLT 210 05/20/2023 0445   MCV 90.8 05/20/2023 0445   MCH 28.9 05/20/2023 0445   MCHC 31.9 05/20/2023 0445   RDW 13.6 05/20/2023 0445   LYMPHSABS 2.7 05/20/2023 0445   MONOABS 1.3 (H) 05/20/2023 0445   EOSABS 0.1 05/20/2023 0445   BASOSABS 0.1 05/20/2023 0445     CMP     Component Value Date/Time   NA 140 05/20/2023 0746   K 4.0 05/20/2023 0746   CL 100 05/20/2023 0746   CO2 22 05/20/2023 0445   GLUCOSE 85 05/20/2023 0746   BUN 10 05/20/2023 0746   CREATININE 1.40 (H) 05/20/2023 0746   CALCIUM 8.5 (L) 05/20/2023 0445    PROT 6.5 05/20/2023 0445   ALBUMIN 3.8 05/20/2023 0445   AST 738 (H) 05/20/2023 0445   ALT 513 (H) 05/20/2023 0445   ALKPHOS 108 05/20/2023 0445   BILITOT 0.8 05/20/2023 0445   GFRNONAA 44 (L) 05/20/2023 0445    ASSESSMENT & PLAN:   Assessment & Plan by Problem: Active Problems:   Drug overdose   Aaron Blankenship is a 33 y.o. p with significant history of polysubstance abuse and alcohol use presenting with epigastric pain, vomiting, and opioid overdose as well as ongoing chest pain and dizziness admitted for observation and further workup on hospital day 0  Epigastric pain Nausea vomiting Unclear etiology, though may be multifactorial.  Certainly has risk factors for gastritis, endorses significant epigastric pain which worsens with eating-concerning for peptic ulcer disease gastritis, esophagitis.  Lipase, less concern for pancreatitis.  He does also endorse black vomiting, though admittedly confounded by chocolate cake consumption.  Has received GI cocktail as well as Zofran .  No signs of peritonitis at this time. Plan: - Consider GI evaluation, possible  EGD given alcohol use and ongoing symptoms. - Pantoprazole  40 mg daily - KUB  Transaminitis Has significant right upper quadrant pain on exam in addition to elevated liver enzymes.  He did undergo right upper quadrant ultrasound which did show some gallbladder wall thickening to 4.7 mm without surrounding fluid or sonographic Murphy sign.  No gallstones visualized.  He does have leukocytosis but no significant bilirubin elevation.  Differential would include cholecystitis, hepatitis, ischemic hepatopathy, alcoholic hepatitis.  Less concern for choledocholithiasis or cholangitis given presentation. Plan: - Trend CMP - Acute hepatitis panel - CT ab pelvis - KUB - Fib 4 score elevated, unclear utility given likely acute elevation in transaminases. - Do have some underlying concern for fibrosis/cirrhosis given alcohol abuse history.   Given ongoing epigastric pain may benefit from endoscopic evaluation for varices.  Leukocytosis Unclear etiology, may be in part due to hemoconcentration.  Does have significant epigastric pain, right upper quadrant pain, left lower quadrant pain which may reflect infection or inflammation. Plan: - Respiratory viral panel - Trend CBC - CT ab pelvis for further evaluation  Polysubstance abuse  Opioid overdose Patient presenting acutely obtunded, apneic, hypoxic, hypotensive.  Now status post 3 L IV fluid with improvement of blood pressure as well as Narcan  IV and IM.  On our exam, patient persistently slightly drowsy but otherwise alert and oriented.  Vitals normalizing.  UDS positive for benzos, cocaine, amphetamine.  Endorses ongoing daily Xanax use for sleep.  No history of withdrawal seizure.  Some concern that patient may have illicit substances in his personal belongings. Plan: - COWS score to monitor for Xanax withdrawal - TOC consult polysubstance abuse - Encourage cessation - Would benefit from establishing with primary care doctor for ongoing care of complex needs. - As needed Narcan  - Additional 1 L NS bolus - Discussed concerns regarding patient's belongings with nursing staff.    Left lower quadrant pain Unclear etiology, patient does endorse ongoing black and green stools for some time.  Denies bright red blood per rectum.  In combination with this leukocytosis I do query something like IBD. Plan: - CT abdomen pelvis - Continue attention on exam  AKI Proteinuria Creatinine 2.0 on admission, elevated from prior.  Did improve with IV fluids.  Likely prerenal.  Does have proteinuria and glycosuria which may reflect underlying kidney dysfunction.  Less likely for ATN though patient presented with significant hypotension.  No significant casts or RBCs on exam. Plan: - Daily chemistry panel - Additional 1 L IV fluid  Anion gap Elevated lactic Bicarbonate normal, gap  elevated.  To query ingestion given history, but likely secondary to increased lactic. Plan: - Trend lactic - IV fluids - Serum osmolality, calculate gap given anion gap and liver injury.  Hyperglycemia Glycosuria Family history of type 2 diabetes, he does not see a physician regularly and generally tries to avoid the healthcare system. He does state he has had increased urinary frequency recently.  He was hyperglycemic despite poor p.o. intake.  He does have glycosuria and proteinuria on his UA.  Some concern for underlying diabetes. Plan: - Hemoglobin A1c  Shortness of breath Chest pain Vascular congestion Orthostatic dizziness Again unknown etiology.  EKG normal.  He does endorse ongoing chest pain and shortness of breath for some months as well as orthostatic dizziness.  Given polysubstance abuse, alcohol use, leukocytosis there is some concern for underlying endo-/myocarditis.  Will workup with echo and troponins.  Diet: Clear liquid VTE: Enoxaparin  IVF: NS,Bolus Code: Full  Prior  to Admission Living Arrangement: Home Anticipated Discharge Location: Home Barriers to Discharge: Workup, clinical improvement.  Dispo: Admit patient to Observation with expected length of stay less than 2 midnights.  Signed: Sheree Dieter, MD Internal Medicine Resident PGY-1  05/20/2023, 11:24 AM

## 2023-05-20 NOTE — ED Provider Notes (Signed)
 [place] EMERGENCY DEPARTMENT AT [facility] Provider Note   CSN: [id number] Arrival date & time: 05/20/23  0441     History  Chief Complaint  Patient presents with   Drug Overdose    Unresponsive    Aaron Blankenship is a 33 y.o. male.  The history is provided by a friend and medical records. The history is limited by the condition of the patient (Patient unresponsive).  Drug Overdose  He was brought to the emergency department by a friend who states that he uses any drugs he can get his hands on.  Patient is reported to have had stertorous respirations on arrival, apneic on my arrival.   Home Medications Prior to Admission medications   Not on File      Allergies    Patient has no allergy information on record.    Review of Systems   Review of Systems  Unable to perform ROS: Patient unresponsive    Physical Exam Updated Vital Signs BP (!) 140/64   Pulse (!) 103   Resp (!) 26   SpO2 (!) 52%  Physical Exam Vitals and nursing note reviewed.   33 year old male, apneic and unresponsive. Vital signs are significant for elevated heart rate.  Respiratory rate is reported at 26, but was 0 on my arrival in the room.  Oxygen saturation is 52%, which is hypoxic. Head is normocephalic and atraumatic.  Pupils are pinpoint. Lungs (after being given naloxone ) have coarse breath sounds throughout. Heart has regular rate and rhythm without murmur. Abdomen is soft, flat. Extremities have no signs of trauma. Skin is warm and dry without rash. Neurologic: Unresponsive, GCS = 3..  ED Results / Procedures / Treatments   Labs (all labs ordered are listed, but only abnormal results are displayed) Labs Reviewed  COMPREHENSIVE METABOLIC PANEL WITH GFR  ETHANOL  CBC WITH DIFFERENTIAL/PLATELET  ACETAMINOPHEN  LEVEL  SALICYLATE LEVEL  URINALYSIS, ROUTINE W REFLEX MICROSCOPIC  RAPID URINE DRUG SCREEN, HOSP PERFORMED    EKG ED ECG REPORT   Date: 05/20/2023  Rate:  133  Rhythm: sinus tachycardia  QRS Axis: normal  Intervals: normal  ST/T Wave abnormalities: normal  Conduction Disutrbances:none  Narrative Interpretation: Sinus tachycardia, no prior electrocardiogram available for comparison.  Old EKG Reviewed: none available  I have personally reviewed the EKG tracing and agree with the computerized printout as noted.  Radiology No results found.  Procedures Procedures  Cardiac monitor shows sinus tachycardia, per my interpretation.  Medications Ordered in ED Medications  naloxone  (NARCAN ) injection 2 mg (has no administration in time range)  naloxone  (NARCAN ) injection 2 mg (has no administration in time range)  ondansetron  (ZOFRAN ) injection 4 mg (has no administration in time range)    ED Course/ Medical Decision Making/ A&P                                 Medical Decision Making Amount and/or Complexity of Data Reviewed Labs: ordered. Radiology: ordered.  Risk OTC drugs. Prescription drug management.   Patient with apparent opioid overdose.  As I arrived in the room, he had already received naloxone  2 mg intramuscularly.  He was apneic and somewhat cyanotic and bag valve mask respirations were initiated.  Because of failure to respond to naloxone , I was preparing to intubate him.  However, he did start having spontaneous respirations and gradually improved to where he was breathing well on his own.  I did order additional naloxone  intravenously.  Following this, patient is noted to be moving all extremities but he is nonverbal and does not follow commands.  He is somewhat agitated.  He has with him bag with white powder in it and also alprazolam tablets.  I have reviewed his electrocardiogram, and my interpretation is sinus tachycardia, marked muscle artifact.  5:00 AM Patient is now awake enough and will tell me his name.  He initially denied using any drugs, but on further insistent questioning, admits to snorting fentanyl.  He is  denying other drug use.  Given his failure to respond initially to naloxone , I doubt this is the case.  6:33 AM Blood pressure has dropped to 80/50.  I have ordered IV fluids.  I have reviewed his laboratory tests, and my interpretation is renal insufficiency with no prior values available for comparison, markedly elevated transaminases likely secondary to ethanol abuse but consider possible hepatitis with history of drug abuse, leukocytosis which is felt to be stress related, undetectable ethanol and salicylate and acetaminophen , normal urinalysis.  Drug screen is positive for amphetamines, benzodiazepines, cocaine.  Opiates not detected, but and fentanyl will frequently go undetected on her drug screens.  He will need to be observed for a total of 4 hours following presentation.  7:53 AM Following saline, blood pressure actually dropped into the 70s.  I ordered additional IV fluids, and blood pressure has come up to a satisfactory range.  He is awake and alert at this time, will still need additional observation before he is safe for discharge.  Case is signed out to Dr. Nora Beal.  CRITICAL CARE Performed by: Alissa April Total critical care time: 125 minutes Critical care time was exclusive of separately billable procedures and treating other patients. Critical care was necessary to treat or prevent imminent or life-threatening deterioration. Critical care was time spent personally by me on the following activities: development of treatment plan with patient and/or surrogate as well as nursing, discussions with consultants, evaluation of patient's response to treatment, examination of patient, obtaining history from patient or surrogate, ordering and performing treatments and interventions, ordering and review of laboratory studies, ordering and review of radiographic studies, pulse oximetry and re-evaluation of patient's condition.  Final Clinical Impression(s) / ED Diagnoses Final diagnoses:   Opiate overdose, accidental or unintentional, initial encounter (HCC)  Polysubstance abuse (HCC)  Elevated transaminase level  Renal insufficiency  Elevated random blood glucose level    Rx / DC Orders ED Discharge Orders     None         Alissa April, MD 05/20/23 [phone]

## 2023-05-21 ENCOUNTER — Other Ambulatory Visit (HOSPITAL_COMMUNITY): Payer: Self-pay

## 2023-05-21 DIAGNOSIS — F1910 Other psychoactive substance abuse, uncomplicated: Secondary | ICD-10-CM

## 2023-05-21 DIAGNOSIS — R7401 Elevation of levels of liver transaminase levels: Secondary | ICD-10-CM

## 2023-05-21 DIAGNOSIS — T40601A Poisoning by unspecified narcotics, accidental (unintentional), initial encounter: Secondary | ICD-10-CM

## 2023-05-21 DIAGNOSIS — N289 Disorder of kidney and ureter, unspecified: Secondary | ICD-10-CM

## 2023-05-21 LAB — CBC WITH DIFFERENTIAL/PLATELET
Abs Immature Granulocytes: 0.06 10*3/uL (ref 0.00–0.07)
Basophils Absolute: 0 10*3/uL (ref 0.0–0.1)
Basophils Relative: 0 %
Eosinophils Absolute: 0.2 10*3/uL (ref 0.0–0.5)
Eosinophils Relative: 1 %
HCT: 46.6 % (ref 39.0–52.0)
Hemoglobin: 15.4 g/dL (ref 13.0–17.0)
Immature Granulocytes: 0 %
Lymphocytes Relative: 12 %
Lymphs Abs: 1.7 10*3/uL (ref 0.7–4.0)
MCH: 28.6 pg (ref 26.0–34.0)
MCHC: 33 g/dL (ref 30.0–36.0)
MCV: 86.6 fL (ref 80.0–100.0)
Monocytes Absolute: 0.4 10*3/uL (ref 0.1–1.0)
Monocytes Relative: 3 %
Neutro Abs: 11.4 10*3/uL — ABNORMAL HIGH (ref 1.7–7.7)
Neutrophils Relative %: 84 %
Platelets: 171 10*3/uL (ref 150–400)
RBC: 5.38 MIL/uL (ref 4.22–5.81)
RDW: 14 % (ref 11.5–15.5)
WBC: 13.8 10*3/uL — ABNORMAL HIGH (ref 4.0–10.5)
nRBC: 0 % (ref 0.0–0.2)

## 2023-05-21 LAB — HCV AB W REFLEX TO QUANT PCR: HCV Ab: NONREACTIVE

## 2023-05-21 LAB — COMPREHENSIVE METABOLIC PANEL WITH GFR
ALT: 295 U/L — ABNORMAL HIGH (ref 0–44)
AST: 125 U/L — ABNORMAL HIGH (ref 15–41)
Albumin: 3.1 g/dL — ABNORMAL LOW (ref 3.5–5.0)
Alkaline Phosphatase: 79 U/L (ref 38–126)
Anion gap: 10 (ref 5–15)
BUN: 6 mg/dL (ref 6–20)
CO2: 24 mmol/L (ref 22–32)
Calcium: 8.4 mg/dL — ABNORMAL LOW (ref 8.9–10.3)
Chloride: 101 mmol/L (ref 98–111)
Creatinine, Ser: 0.91 mg/dL (ref 0.61–1.24)
GFR, Estimated: >60 mL/min (ref >60)
Glucose, Bld: 93 mg/dL (ref 70–99)
Potassium: 3.6 mmol/L (ref 3.5–5.1)
Sodium: 135 mmol/L (ref 135–145)
Total Bilirubin: 1 mg/dL (ref 0.0–1.2)
Total Protein: 6.2 g/dL — ABNORMAL LOW (ref 6.5–8.1)

## 2023-05-21 LAB — HEMOGLOBIN A1C
Hgb A1c MFr Bld: 5.4 % (ref 4.8–5.6)
Mean Plasma Glucose: 108 mg/dL

## 2023-05-21 LAB — HCV INTERPRETATION

## 2023-05-21 LAB — HEPATITIS B CORE ANTIBODY, TOTAL: HEP B CORE AB: NEGATIVE

## 2023-05-21 LAB — HEPATITIS A ANTIBODY, TOTAL: hep A Total Ab: REACTIVE — AB

## 2023-05-21 MED ORDER — ALBUTEROL SULFATE (2.5 MG/3ML) 0.083% IN NEBU
3.0000 mL | INHALATION_SOLUTION | RESPIRATORY_TRACT | 12 refills | Status: DC | PRN
  Filled 2023-05-21: qty 90, 5d supply, fill #0

## 2023-05-21 MED ORDER — PANTOPRAZOLE SODIUM 40 MG PO TBEC
40.0000 mg | DELAYED_RELEASE_TABLET | Freq: Every day | ORAL | 0 refills | Status: AC
  Filled 2023-05-21: qty 30, 30d supply, fill #0

## 2023-05-21 NOTE — Discharge Summary (Signed)
 Name: Aaron Blankenship MRN: [id number] DOB: [birth year] 33 y.o. PCP: Pcp, No  Date of Admission: 05/20/2023  4:41 AM Date of Discharge:  05/21/2023 Attending Physician: Dr. Alwin Baars  DISCHARGE DIAGNOSIS:  Primary Problem: Opiate overdose Aaron [facility])   [facility] Problems: Principal Problem:   Opiate overdose (HCC) Active Problems:   Polysubstance abuse (HCC)   Renal insufficiency   Elevated transaminase level    DISCHARGE MEDICATIONS:   Allergies as of 05/21/2023   No Known Allergies      Medication List     TAKE these medications    albuterol  (2.5 MG/3ML) 0.083% nebulizer solution Commonly known as: PROVENTIL  Inhale 3 mLs into the lungs every 4 (four) hours as needed for wheezing or shortness of breath.   pantoprazole  40 MG tablet Commonly known as: PROTONIX  Take 1 tablet (40 mg total) by mouth daily. Start taking on: May 22, 2023        DISPOSITION AND FOLLOW-UP:  Mr.Aaron Blankenship was discharged from [facility] in [place] condition. At the [facility] follow up visit please address:  Follow-up Recommendations: Consults: Will follow with psychiatry outpatient Labs: CBC, CMP.  Please follow-up pending labs, hepatitis labs, GC chlamydia, RPR. Studies: Consider elastography/EGD as indicated at clinical follow-up. Medications: Will prescribe Protonix  and albuterol  on discharge.  Follow-up Appointments:  Follow-up Information     Paseda, Folashade R, FNP Follow up on 05/28/2023.   Specialty: Nurse Practitioner Why: @ 9:45am  [address] Contact information: [address], [address] [place] [address] [phone]         Paseda, Folashade R, FNP .   Specialty: Nurse Practitioner Contact information: [address] [place] [address] [place], [place] [address] [phone]                 [facility] COURSE:  Patient Summary: Aaron Blankenship is a 33 y.o. p with significant history of polysubstance abuse and alcohol use presenting with  epigastric pain, vomiting, and opioid overdose as well as ongoing chest pain and dizziness admitted for observation.   Epigastric pain Nausea vomiting Unclear etiology, though may be multifactorial.  Certainly has risk factors for gastritis, endorses significant epigastric pain which worsens with eating-concerning for peptic ulcer disease gastritis, esophagitis.  Lipase, less concern for pancreatitis.  He does also endorse black vomiting as well as occasional dark stools.  No signs of peritonitis, hemoglobin stable this admission. Plan: - Protonix  40 mg daily - PCP follow-up, consider GI referral - Pantoprazole  40 mg daily   Transaminitis Presented with right upper quadrant pain in addition to elevated liver enzymes.  He did undergo right upper quadrant ultrasound which did show some gallbladder wall thickening to 4.7 mm without surrounding fluid or sonographic Murphy sign.  Bilirubin normal, hepatitis serologies normal so far, though some waiting to results.  CT scan normal.  Improved throughout [facility] stay, most likely representing ischemic liver injury. Plan: - Repeat CMP at follow-up - Follow-up pending hepatitis serologies -Recalculate fib 4 score follow-up once liver enzymes have had a chance to return to baseline - Do have some underlying concern for fibrosis/cirrhosis given alcohol abuse history.  Given ongoing epigastric pain may benefit from endoscopic evaluation for varices. - Hep B surface antibody negative during this admission, will need hep B vaccination - Would benefit from hepatitis A vaccination   Leukocytosis Unclear etiology, may be in part due to hemoconcentration.  CT did show some basilar pulmonary opacities, though patient denies cough, shortness of breath, fevers.  Does state he  has a history of reactive airway disease and has used inhaler in the past. Plan: - Trend CBC at follow-up - No clinical signs of pneumonia to correlate with imaging findings.  Please  reevaluate at follow-up visit. - RPR, GC chlamydia pending.   Polysubstance abuse  Opioid overdose Patient presenting acutely obtunded, apneic, hypoxic, hypotensive.  Was given aggressive IV fluid resuscitation, and of blood pressure improved.  He did require Narcan  IV and IM.  UDS positive for benzos, cocaine, amphetamine.  Endorses ongoing daily Xanax use for sleep.  No history of withdrawal seizure. He did receive 1 dose of lorazepam  during his admission here due to symptoms of Xanax withdrawal. Psychiatry was asked to assess the patient in setting of overdose as well as possible Xanax withdrawal.  Psychiatry felt that he was cleared for discharge from their perspective.  Will provide resources for outpatient follow-up. Plan: - Cleared for discharge per psychiatry - TOC consult polysubstance abuse - Encouraged cessation - Would benefit from establishing with primary care doctor for ongoing care of complex needs. - Resources provided for outpatient follow-up   Left lower quadrant pain Unclear etiology, patient does endorse ongoing black and green stools for some time.  Resolved overnight with [facility] stay.   AKI Proteinuria Creatinine 2.0 on admission, elevated from prior.  Normalized with time and IV fluids. Does have proteinuria and glycosuria which may reflect underlying kidney dysfunction. Plan: -Repeat chemistry panel at follow-up - Consider repeat UA   Anion gap Elevated lactic Resolved with IV fluids.   Hyperglycemia Glycosuria Family history of type 2 diabetes, he does not see a physician regularly and generally tries to avoid the healthcare system. He does state he has had increased urinary frequency recently.  A1c normal.   Shortness of breath Chest pain Vascular congestion Orthostatic dizziness Again unknown etiology.  EKG normal.  He does endorse ongoing chest pain and shortness of breath for some months as well as orthostatic dizziness.  Likely secondary to  polysubstance abuse, continue workup at follow-up recommended.  Pars defect  Noted incidentally on CT imaging, this was discussed with the patient.   DISCHARGE INSTRUCTIONS:    SUBJECTIVE:  Seen on rounds today, feeling much better.  Would like to go home.  Will have psychiatry to evaluate the patient.  Discussed dangers of Xanax withdrawals, signs and symptoms discussed, return precautions discussed.   Discharge Vitals:   BP 110/64 (BP Location: Right Arm)   Pulse 79   Temp 98.3 F (36.8 C) (Oral)   Resp 16   Ht 5\' 10"  (1.778 m)   Wt 74.8 kg   SpO2 96%   BMI 23.66 kg/m   OBJECTIVE:  Physical Exam Cardiovascular:     Rate and Rhythm: Normal rate and regular rhythm.  Pulmonary:     Effort: Pulmonary effort is normal.     Breath sounds: Normal breath sounds.  Abdominal:     Comments: Slight epigastric tenderness palpation,  Neurological:     Mental Status: He is alert.      Pertinent Labs, Studies, and Procedures:     Latest Ref Rng & Units 05/21/2023    2:41 AM 05/20/2023    7:46 AM 05/20/2023    4:45 AM  CBC  WBC 4.0 - 10.5 K/uL 13.8   29.2   Hemoglobin 13.0 - 17.0 g/dL 78.2  95.6  21.3   Hematocrit 39.0 - 52.0 % 46.6  37.0  45.2   Platelets 150 - 400 K/uL 171   210  Latest Ref Rng & Units 05/21/2023    2:41 AM 05/20/2023    7:46 AM 05/20/2023    4:45 AM  CMP  Glucose 70 - 99 mg/dL 93  85  161   BUN 6 - 20 mg/dL 6  10  10    Creatinine 0.61 - 1.24 mg/dL 0.96  0.45  4.09   Sodium 135 - 145 mmol/L 135  140  136   Potassium 3.5 - 5.1 mmol/L 3.6  4.0  3.9   Chloride 98 - 111 mmol/L 101  100  95   CO2 22 - 32 mmol/L 24   22   Calcium 8.9 - 10.3 mg/dL 8.4   8.5   Total Protein 6.5 - 8.1 g/dL 6.2   6.5   Total Bilirubin 0.0 - 1.2 mg/dL 1.0   0.8   Alkaline Phos 38 - 126 U/L 79   108   AST 15 - 41 U/L 125   738   ALT 0 - 44 U/L 295   513     CT ABDOMEN PELVIS WO CONTRAST Result Date: 05/20/2023 CLINICAL DATA:  Abdominal pain, acute, nonlocalized EXAM:  CT ABDOMEN AND PELVIS WITHOUT CONTRAST TECHNIQUE: Multidetector CT imaging of the abdomen and pelvis was performed following the standard protocol without IV contrast. RADIATION DOSE REDUCTION: This exam was performed according to the departmental dose-optimization program which includes automated exposure control, adjustment of the mA and/or kV according to patient size and/or use of iterative reconstruction technique. COMPARISON:  Chest radiographs 05/20/2023 and 10/15/2020. Abdominopelvic CT 08/12/2016. FINDINGS: Lower chest: There are new peribronchovascular airspace opacities at both lung bases (left greater than right) suspicious for pneumonia. No significant pleural or pericardial effusion. Hepatobiliary: The liver appears unremarkable as imaged in the noncontrast state. No evidence of gallstones, gallbladder wall thickening or biliary dilatation. Pancreas: Unremarkable. No pancreatic ductal dilatation or surrounding inflammatory changes. Spleen: Normal in size without focal abnormality. Adrenals/Urinary Tract: Both adrenal glands appear normal. No evidence of urinary tract calculus, hydronephrosis or perinephric soft tissue stranding. No focal renal abnormalities are identified on noncontrast imaging. The bladder appears unremarkable for its degree of distention. Stomach/Bowel: No enteric contrast administered. The stomach appears unremarkable for its degree of distension. No evidence of bowel wall thickening, distention or surrounding inflammatory change. The appendix appears normal. Vascular/Lymphatic: There are no enlarged abdominal or pelvic lymph nodes. No significant vascular findings on noncontrast imaging. Reproductive: The prostate gland and seminal vesicles appear unremarkable. Other: No evidence of abdominal wall mass or hernia. No ascites or pneumoperitoneum. Musculoskeletal: No evidence of acute fracture or aggressive osseous lesion. 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 IMPRESSION: 1. No acute findings or explanation for the patient's symptoms in the abdomen or pelvis. 2. New peribronchovascular airspace opacities at both lung bases (left greater than right) suspicious for pneumonia. 3. 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 Electronically Signed   By: Elmon Hagedorn M.D.   On: 05/20/2023 11:33   Abd 1 View (KUB) Result Date: 05/20/2023 CLINICAL DATA:  Abdominal pain EXAM: ABDOMEN - 1 VIEW COMPARISON:  None Available. FINDINGS: The bowel gas pattern is normal. No radio-opaque calculi or other significant radiographic abnormality are seen. IMPRESSION: Negative. Electronically Signed   By: Juanetta Nordmann M.D.   On: 05/20/2023 11:20   US  Abdomen Limited RUQ (LIVER/GB) Result Date: 05/20/2023 CLINICAL DATA:  Elevated ALT. In emergency department for drug overdose and  unresponsiveness. EXAM: ULTRASOUND ABDOMEN LIMITED RIGHT UPPER QUADRANT COMPARISON:  CT AP from 08/12/2016 FINDINGS: Gallbladder: No gallstones, pericholecystic fluid, sludge or positive sonographic Murphy's sign. Gallbladder wall appears diffusely thickened measuring up to 4.7 mm. Common bile duct: Diameter: 5 mm.  No intrahepatic bile duct dilatation. Liver: No focal lesion identified. Within normal limits in parenchymal echogenicity. Portal vein is patent on color Doppler imaging with normal direction of blood flow towards the liver. Other: None. IMPRESSION: 1. No gallstones or pericholecystic. Negative sonographic Murphy's sign 2. Gallbladder wall appears thickened measuring up to 4.7 mm. This is a nonspecific finding. Electronically Signed   By: Kimberley Penman M.D.   On: 05/20/2023 08:57   DG Chest Port 1 View Result Date: 05/20/2023 CLINICAL DATA:  Overdose.  Unresponsiveness. EXAM: PORTABLE CHEST 1 VIEW COMPARISON:  None Available. FINDINGS: Heart size and mediastinal contours appear normal. No  pleural fluid. Increased pulmonary vascular congestion without frank edema. No airspace consolidation or pneumothorax. Visualized osseous structures are unremarkable. IMPRESSION: Increased pulmonary vascular congestion without frank edema. Electronically Signed   By: Kimberley Penman M.D.   On: 05/20/2023 05:29     Signed: Sheree Dieter MD Internal Medicine Resident, PGY-1 [facility]  Pager: [phone] 1:16 PM, 05/21/2023

## 2023-05-21 NOTE — Plan of Care (Signed)

## 2023-05-21 NOTE — Progress Notes (Addendum)
 CSW added outpatient substance use treatment services resources to patients AVS.

## 2023-05-21 NOTE — Consult Note (Signed)
 [facility] Psychiatric Consult Initial  Patient Name: .Aaron Blankenship  MRN: [id number]  DOB: 19-May-1990  Consult Order details:  Orders (From admission, onward)     Start     Ordered   05/21/23 0823  IP CONSULT TO PSYCHIATRY       Ordering Provider: Sheree Dieter, MD  Provider:  (Not yet assigned)  Question Answer Comment  Location [facility]   Reason for Consult? overdose, Xanax withdrawal      05/21/23 0102             Mode of Visit: In person    Psychiatry Consult Evaluation  Service Date: May 21, 2023 LOS:  LOS: 0 days  Chief Complaint Substance Abuse  Primary Psychiatric Diagnoses  Opiate Use Disorder 2.  Sedative Hypnotic Use Disorder 3.  Anxiety  Assessment  Aaron Blankenship is a 33 y.o. male admitted: Medicallyfor 05/20/2023  4:41 AM for an opiate overdose. He carries the psychiatric diagnoses of Opiate Use Disorder, Sedative Hypnotic Use Disorder, and Anxiety.   His current presentation of recent drug overdose in the presence of opioids and benzodiazepines is most consistent with the above diagnoses. He meets criteria for these diagnoses based on DSM 5 Criteria. He is not currently followed up with any psychiatric services. On initial examination, patient reported that he recently relapsed on opioids after being sober for four months. He states that he has been using Xanax 2 mg nightly that he has been receiving from the street to help with the anxiety from not using opioids. The patient plans on stopping his Xanax use abruptly after using for the past two weeks. He states that he has stopped his substance abuse abruptly in the past and he has never had any withdrawal seizures or delirium tremens. The patient does not have any interest in rehab or detox currently. The patient was interested in starting medication to help with sleep and he was directed to follow up with the outpatient clinic at the [facility] to establish psychiatric care. Please see plan  below for detailed recommendations.   Diagnoses:  Active Hospital problems: Principal Problem:   Opiate overdose (HCC) Active Problems:   Polysubstance abuse (HCC)   Renal insufficiency   Elevated transaminase level    Plan   ## Psychiatric Medication Recommendations:  -No medication recommendations  ## Medical Decision Making Capacity: Not specifically addressed in this encounter  ## Further Work-up:  -- Per Primary Team -- Pertinent labwork reviewed earlier this admission includes:  Recent Results (from the past 2160 hours)  Ethanol     Status: None   Collection Time: 05/20/23  4:15 AM  Result Value Ref Range   Alcohol, Ethyl (B) <15 <15 mg/dL    Comment: Please note change in reference range. (NOTE) For medical purposes only. Performed at [facility], [address] [place], [place] [address]   Acetaminophen  level     Status: Abnormal   Collection Time: 05/20/23  4:15 AM  Result Value Ref Range   Acetaminophen  (Tylenol ), Serum <10 (L) 10 - 30 ug/mL    Comment: (NOTE) Therapeutic concentrations vary significantly. A range of 10-30 ug/mL  may be an effective concentration for many patients. However, some  are best treated at concentrations outside of this range. Acetaminophen  concentrations >150 ug/mL at 4 hours after ingestion  and >50 ug/mL at 12 hours after ingestion are often associated with  toxic reactions.  Performed at [facility], [address] [place], [place]  [address]   Salicylate level     Status: Abnormal   Collection Time: 05/20/23  4:15 AM  Result Value Ref Range   Salicylate Lvl <7.0 (L) 7.0 - 30.0 mg/dL    Comment: Performed at [facility], [address] [place], [place] [address]  Urine rapid drug screen (hosp performed)     Status: Abnormal   Collection Time: 05/20/23  4:44 AM  Result Value Ref Range   Opiates NONE DETECTED NONE DETECTED   Cocaine POSITIVE (A) NONE DETECTED   Benzodiazepines POSITIVE (A) NONE  DETECTED   Amphetamines POSITIVE (A) NONE DETECTED    Comment: (NOTE) Trazodone is metabolized in vivo to several metabolites, including pharmacologically active m-CPP, which is excreted in the urine. Immunoassay screens for amphetamines and MDMA have potential cross-reactivity with these compounds and may provide false positive  results.     Tetrahydrocannabinol NONE DETECTED NONE DETECTED   Barbiturates NONE DETECTED NONE DETECTED    Comment: (NOTE) DRUG SCREEN FOR MEDICAL PURPOSES ONLY.  IF CONFIRMATION IS NEEDED FOR ANY PURPOSE, NOTIFY LAB WITHIN 5 DAYS.  LOWEST DETECTABLE LIMITS FOR URINE DRUG SCREEN Drug Class                     Cutoff (ng/mL) Amphetamine and metabolites    1000 Barbiturate and metabolites    200 Benzodiazepine                 200 Opiates and metabolites        300 Cocaine and metabolites        300 THC                            50 Performed at [facility], [address], [place], [place] [address]   Comprehensive metabolic panel     Status: Abnormal   Collection Time: 05/20/23  4:45 AM  Result Value Ref Range   Sodium 136 135 - 145 mmol/L   Potassium 3.9 3.5 - 5.1 mmol/L    Comment: HEMOLYSIS AT THIS LEVEL MAY AFFECT RESULT   Chloride 95 (L) 98 - 111 mmol/L   CO2 22 22 - 32 mmol/L   Glucose, Bld 216 (H) 70 - 99 mg/dL    Comment: Glucose reference range applies only to samples taken after fasting for at least 8 hours.   BUN 10 6 - 20 mg/dL   Creatinine, Ser 4.03 (H) 0.61 - 1.24 mg/dL   Calcium 8.5 (L) 8.9 - 10.3 mg/dL   Total Protein 6.5 6.5 - 8.1 g/dL   Albumin 3.8 3.5 - 5.0 g/dL   AST 474 (H) 15 - 41 U/L    Comment: HEMOLYSIS AT THIS LEVEL MAY AFFECT RESULT   ALT 513 (H) 0 - 44 U/L    Comment: HEMOLYSIS AT THIS LEVEL MAY AFFECT RESULT   Alkaline Phosphatase 108 38 - 126 U/L   Total Bilirubin 0.8 0.0 - 1.2 mg/dL    Comment: HEMOLYSIS AT THIS LEVEL MAY AFFECT RESULT   GFR, Estimated 44 (L) >60 mL/min    Comment: (NOTE) Calculated  using the CKD-EPI Creatinine Equation (2021)    Anion gap 19 (H) 5 - 15    Comment: Performed at [facility], [address], [place], [place] [address]  CBC with Differential     Status: Abnormal   Collection Time: 05/20/23  4:45 AM  Result Value Ref Range   WBC  29.2 (H) 4.0 - 10.5 K/uL   RBC 4.98 4.22 - 5.81 MIL/uL   Hemoglobin 14.4 13.0 - 17.0 g/dL   HCT 16.1 09.6 - 04.5 %   MCV 90.8 80.0 - 100.0 fL   MCH 28.9 26.0 - 34.0 pg   MCHC 31.9 30.0 - 36.0 g/dL   RDW 40.9 81.1 - 91.4 %   Platelets 210 150 - 400 K/uL   nRBC 0.0 0.0 - 0.2 %   Neutrophils Relative % 85 %   Neutro Abs 24.8 (H) 1.7 - 7.7 K/uL   Lymphocytes Relative 9 %   Lymphs Abs 2.7 0.7 - 4.0 K/uL   Monocytes Relative 5 %   Monocytes Absolute 1.3 (H) 0.1 - 1.0 K/uL   Eosinophils Relative 0 %   Eosinophils Absolute 0.1 0.0 - 0.5 K/uL   Basophils Relative 0 %   Basophils Absolute 0.1 0.0 - 0.1 K/uL   Immature Granulocytes 1 %   Abs Immature Granulocytes 0.21 (H) 0.00 - 0.07 K/uL    Comment: Performed at [facility] Lab, [address], [place], [place] [address]  Urinalysis, Routine w reflex microscopic -Urine, Catheterized     Status: Abnormal   Collection Time: 05/20/23  4:48 AM  Result Value Ref Range   Color, Urine YELLOW YELLOW   APPearance HAZY (A) CLEAR   Specific Gravity, Urine 1.005 1.005 - 1.030   pH 7.0 5.0 - 8.0   Glucose, UA 50 (A) NEGATIVE mg/dL   Hgb urine dipstick NEGATIVE NEGATIVE   Bilirubin Urine NEGATIVE NEGATIVE   Ketones, ur NEGATIVE NEGATIVE mg/dL   Protein, ur 30 (A) NEGATIVE mg/dL   Nitrite NEGATIVE NEGATIVE   Leukocytes,Ua TRACE (A) NEGATIVE   RBC / HPF 0-5 0 - 5 RBC/hpf   WBC, UA 6-10 0 - 5 WBC/hpf   Bacteria, UA RARE (A) NONE SEEN   Squamous Epithelial / HPF 0-5 0 - 5 /HPF   Mucus PRESENT    Hyaline Casts, UA PRESENT    Sperm, UA PRESENT     Comment: Performed at [facility], [address], [place], [place] [address]  CK     Status: None   Collection Time:  05/20/23  7:23 AM  Result Value Ref Range   Total CK 238 49 - 397 U/L    Comment: Performed at [facility], [address] [place], [place] [address]  Lipase, blood     Status: None   Collection Time: 05/20/23  7:23 AM  Result Value Ref Range   Lipase 25 11 - 51 U/L    Comment: Performed at [facility], [address], [place], [place] [address]  I-stat chem 8, ed     Status: Abnormal   Collection Time: 05/20/23  7:46 AM  Result Value Ref Range   Sodium 140 135 - 145 mmol/L   Potassium 4.0 3.5 - 5.1 mmol/L   Chloride 100 98 - 111 mmol/L   BUN 10 6 - 20 mg/dL   Creatinine, Ser 9.52 (H) 0.61 - 1.24 mg/dL   Glucose, Bld 85 70 - 99 mg/dL    Comment: Glucose reference range applies only to samples taken after fasting for at least 8 hours.   Calcium, Ion 1.07 (L) 1.15 - 1.40 mmol/L   TCO2 27 22 - 32 mmol/L   Hemoglobin 12.6 (L) 13.0 - 17.0 g/dL   HCT 84.1 (L) 32.4 - 40.1 %  I-Stat Lactic Acid     Status: Abnormal  Collection Time: 05/20/23  9:11 AM  Result Value Ref Range   Lactic Acid, Venous 2.6 (HH) 0.5 - 1.9 mmol/L   Comment NOTIFIED PHYSICIAN   Resp panel by RT-PCR (RSV, Flu A&B, Covid) Anterior Nasal Swab     Status: None   Collection Time: 05/20/23 10:27 AM   Specimen: Anterior Nasal Swab  Result Value Ref Range   SARS Coronavirus 2 by RT PCR NEGATIVE NEGATIVE   Influenza A by PCR NEGATIVE NEGATIVE   Influenza B by PCR NEGATIVE NEGATIVE    Comment: (NOTE) The Xpert Xpress SARS-CoV-2/FLU/RSV plus assay is intended as an aid in the diagnosis of influenza from Nasopharyngeal swab specimens and should not be used as a sole basis for treatment. Nasal washings and aspirates are unacceptable for Xpert Xpress SARS-CoV-2/FLU/RSV testing.  Fact Sheet for Patients: BloggerCourse.com  Fact Sheet for Healthcare Providers: SeriousBroker.it  This test is not yet approved or cleared by the [place]  FDA and has  been authorized for detection and/or diagnosis of SARS-CoV-2 by FDA under an Emergency Use Authorization (EUA). This EUA will remain in effect (meaning this test can be used) for the duration of the COVID-19 declaration under Section 564(b)(1) of the Act, 21 U.S.C. section 360bbb-3(b)(1), unless the authorization is terminated or revoked.     Resp Syncytial Virus by PCR NEGATIVE NEGATIVE    Comment: (NOTE) Fact Sheet for Patients: BloggerCourse.com  Fact Sheet for Healthcare Providers: SeriousBroker.it  This test is not yet approved or cleared by the [place]  FDA and has been authorized for detection and/or diagnosis of SARS-CoV-2 by FDA under an Emergency Use Authorization (EUA). This EUA will remain in effect (meaning this test can be used) for the duration of the COVID-19 declaration under Section 564(b)(1) of the Act, 21 U.S.C. section 360bbb-3(b)(1), unless the authorization is terminated or revoked.  Performed at [facility], [address] [place], [place] [address]   Hepatitis panel, acute     Status: None   Collection Time: 05/20/23 11:55 AM  Result Value Ref Range   Hepatitis B Surface Ag NON REACTIVE NON REACTIVE   HCV Ab NON REACTIVE NON REACTIVE    Comment: (NOTE) Nonreactive HCV antibody screen is consistent with no HCV infections,  unless recent infection is suspected or other evidence exists to indicate HCV infection.     Hep A IgM NON REACTIVE NON REACTIVE   Hep B C IgM NON REACTIVE NON REACTIVE    Comment: Performed at [facility], [address], [place], [place] [address]  HIV Antibody (routine testing w rflx)     Status: None   Collection Time: 05/20/23 11:55 AM  Result Value Ref Range   HIV Screen 4th Generation wRfx Non Reactive Non Reactive    Comment: Performed at [facility], [address], [place], [place] [address]  Hepatitis B surface antibody,qualitative     Status:  None   Collection Time: 05/20/23 11:55 AM  Result Value Ref Range   Hep B S Ab NON REACTIVE NON REACTIVE    Comment: (NOTE) Inconsistent with immunity, less than 10 mIU/mL.  Performed at [facility], [address] [place], [place] [address]   Osmolality     Status: None   Collection Time: 05/20/23 11:55 AM  Result Value Ref Range   Osmolality 286 275 - 295 mOsm/kg    Comment: Performed at [facility], [address], [place], [place] [address]  I-Stat Lactic Acid  Status: None   Collection Time: 05/20/23 12:27 PM  Result Value Ref Range   Lactic Acid, Venous 1.8 0.5 - 1.9 mmol/L  Hemoglobin A1c     Status: None   Collection Time: 05/20/23  3:44 PM  Result Value Ref Range   Hgb A1c MFr Bld 5.4 4.8 - 5.6 %    Comment: (NOTE)         Prediabetes: 5.7 - 6.4         Diabetes: >6.4         Glycemic control for adults with diabetes: <7.0    Mean Plasma Glucose 108 mg/dL    Comment: (NOTE) Performed At: [facility] [address] [place], [place] [id number] Pearlean Botts MD WU:[id number]   Comprehensive metabolic panel     Status: Abnormal   Collection Time: 05/21/23  2:41 AM  Result Value Ref Range   Sodium 135 135 - 145 mmol/L   Potassium 3.6 3.5 - 5.1 mmol/L   Chloride 101 98 - 111 mmol/L   CO2 24 22 - 32 mmol/L   Glucose, Bld 93 70 - 99 mg/dL    Comment: Glucose reference range applies only to samples taken after fasting for at least 8 hours.   BUN 6 6 - 20 mg/dL   Creatinine, Ser 9.56 0.61 - 1.24 mg/dL    Comment: DELTA CHECK NOTED   Calcium 8.4 (L) 8.9 - 10.3 mg/dL   Total Protein 6.2 (L) 6.5 - 8.1 g/dL   Albumin 3.1 (L) 3.5 - 5.0 g/dL   AST 213 (H) 15 - 41 U/L   ALT 295 (H) 0 - 44 U/L   Alkaline Phosphatase 79 38 - 126 U/L   Total Bilirubin 1.0 0.0 - 1.2 mg/dL   GFR, Estimated >08 >65 mL/min    Comment: (NOTE) Calculated using the CKD-EPI Creatinine Equation (2021)    Anion gap 10 5 - 15    Comment: Performed at [facility]  Lab, [address], [place], [place] [address]  CBC with Differential/Platelet     Status: Abnormal   Collection Time: 05/21/23  2:41 AM  Result Value Ref Range   WBC 13.8 (H) 4.0 - 10.5 K/uL   RBC 5.38 4.22 - 5.81 MIL/uL   Hemoglobin 15.4 13.0 - 17.0 g/dL   HCT 62.9 52.8 - 41.3 %   MCV 86.6 80.0 - 100.0 fL   MCH 28.6 26.0 - 34.0 pg   MCHC 33.0 30.0 - 36.0 g/dL   RDW 24.4 01.0 - 27.2 %   Platelets 171 150 - 400 K/uL   nRBC 0.0 0.0 - 0.2 %   Neutrophils Relative % 84 %   Neutro Abs 11.4 (H) 1.7 - 7.7 K/uL   Lymphocytes Relative 12 %   Lymphs Abs 1.7 0.7 - 4.0 K/uL   Monocytes Relative 3 %   Monocytes Absolute 0.4 0.1 - 1.0 K/uL   Eosinophils Relative 1 %   Eosinophils Absolute 0.2 0.0 - 0.5 K/uL   Basophils Relative 0 %   Basophils Absolute 0.0 0.0 - 0.1 K/uL   Immature Granulocytes 0 %   Abs Immature Granulocytes 0.06 0.00 - 0.07 K/uL    Comment: Performed at [facility] Lab, [address], [place], [place] [address]      ## Disposition:-- There are no psychiatric contraindications to discharge at this time -Patient encouraged to follow up at the [facility] outpatient clinic for outpatient mental health treatment.  ## Behavioral / Environmental: - No specific recommendations  at this time.     ## Safety and Observation Level:  - Based on my clinical evaluation, I estimate the patient to be at low risk of self harm in the current setting. - At this time, we recommend  routine. This decision is based on my review of the chart including patient's history and current presentation, interview of the patient, mental status examination, and consideration of suicide risk including evaluating suicidal ideation, plan, intent, suicidal or self-harm behaviors, risk factors, and protective factors. This judgment is based on our ability to directly address suicide risk, implement suicide prevention strategies, and develop a safety plan while the patient is in the clinical setting. Please contact our  team if there is a concern that risk level has changed.  CSSR Risk Category:C-SSRS RISK CATEGORY: No Risk  Suicide Risk Assessment: Patient has following modifiable risk factors for suicide: recklessness, which we are addressing by discussing outpatient behavioral health options. Patient has following non-modifiable or demographic risk factors for suicide: male gender Patient has the following protective factors against suicide: Access to outpatient mental health care, Supportive friends, and no history of suicide attempts  Thank you for this consult request. Recommendations have been communicated to the primary team.  We will sign off at this time.   Roseline Conine, DO       History of Present Illness  Patient Report:  On initial examination, patient reported that he recently relapsed on opioids after being sober for four months. He states that he has been using Xanax 2 mg nightly that he has been receiving from the street to help with the anxiety from not using opioids. The patient plans on stopping his Xanax use abruptly after using for the past two weeks. He states that he has stopped his substance abuse abruptly in the past and he has never had any withdrawal seizures or delirium tremens. The patient does not have any interest in rehab or detox currently. The patient was interested in starting medication to help with sleep and he was directed to follow up with the outpatient clinic at the [facility] to establish psychiatric care.  Psych ROS:  Depression: No Anxiety:  yes Mania (lifetime and current): No Psychosis: (lifetime and current): No  ROS   Psychiatric and Social History  Psychiatric History:  Information collected from Patient  Prev Dx/Sx: None  Current Psych Provider: None Home Meds (current): None Previous Med Trials: Vistaril  Therapy: None  Prior Psych Hospitalization: Denies  Prior Self Harm: Denies Prior Violence: Denies  Family Psych History: Denies Family Hx  suicide: Denies  Social History:  Developmental Hx: Unremarkable Educational Hx: HS Graduate Occupational Hx: Employed in Warden/ranger Hx: Denies Living Situation: Home with friends family Spiritual Hx: Denies Access to weapons/lethal means: Denies   Substance History Alcohol: Rare use  Type of alcohol NA Last Drink NA Number of drinks per day NA History of alcohol withdrawal seizures Denies History of DT's Denies Tobacco: Yes Illicit drugs: History of Opioid abuse Prescription drug abuse: History of Xanax abuse Rehab hx: Two rehab admissions  Exam Findings  Physical Exam:  Vital Signs:  Temp:  [98.1 F (36.7 C)-98.9 F (37.2 C)] 98.6 F (37 C) (05/12 0935) Pulse Rate:  [80-102] 85 (05/12 0935) Resp:  [10-20] 16 (05/12 0935) BP: (95-132)/(66-95) 105/67 (05/12 0935) SpO2:  [96 %-100 %] 96 % (05/12 0935) Weight:  [74.8 kg] 74.8 kg (05/11 1458) Blood pressure 105/67, pulse 85, temperature 98.6 F (37 C), temperature source Oral, resp. rate  16, height 5\' 10"  (1.778 m), weight 74.8 kg, SpO2 96%. Body mass index is 23.66 kg/m.  Physical Exam  Mental Status Exam: General Appearance: Casual  Orientation:  Full (Time, Place, and Person)  Memory:  Immediate;   Good Recent;   Good Remote;   Good  Concentration:  Concentration: Good and Attention Span: Good  Recall:  Good  Attention  Good  Eye Contact:  Good  Speech:  Clear and Coherent  Language:  Good  Volume:  Normal  Mood: Anxious  Affect:  Appropriate  Thought Process:  Coherent  Thought Content:  Logical  Suicidal Thoughts:  No  Homicidal Thoughts:  No  Judgement:  Fair  Insight:  Fair  Psychomotor Activity:  Normal  Akathisia:  NA  Fund of Knowledge:  Fair      Assets:  Manufacturing systems engineer Housing Resilience  Cognition:  WNL  ADL's:  Intact  AIMS (if indicated):        Other History   These have been pulled in through the EMR, reviewed, and updated if appropriate.  Family History:  The  patient's family history is not on file.  Medical History: Past Medical History:  Diagnosis Date   Polysubstance abuse [facility])     Surgical History: History reviewed. No pertinent surgical history.   Medications:   Current Facility-Administered Medications:    albuterol  (PROVENTIL ) (2.5 MG/3ML) 0.083% nebulizer solution 3 mL, 3 mL, Inhalation, Q4H PRN, Sandie Cross, MD   enoxaparin  (LOVENOX ) injection 40 mg, 40 mg, Subcutaneous, Q24H, Gomez-Caraballo, Maria, MD, 40 mg at 05/20/23 2009   folic acid  (FOLVITE ) tablet 1 mg, 1 mg, Oral, Daily, Gomez-Caraballo, Maria, MD, 1 mg at 05/21/23 5784   hydrOXYzine  (ATARAX ) tablet 25 mg, 25 mg, Oral, TID PRN, Gomez-Caraballo, Maria, MD, 25 mg at 05/20/23 1843   multivitamin with minerals tablet 1 tablet, 1 tablet, Oral, Daily, Gomez-Caraballo, Maria, MD, 1 tablet at 05/21/23 0907   naloxone  (NARCAN ) injection 0.4 mg, 0.4 mg, Intravenous, PRN, Youssefzadeh, Keon, MD   nicotine  (NICODERM CQ  - dosed in mg/24 hours) patch 14 mg, 14 mg, Transdermal, Daily, Gomez-Caraballo, Maria, MD, 14 mg at 05/21/23 0906   pantoprazole  (PROTONIX ) EC tablet 40 mg, 40 mg, Oral, Daily, Alida Ape, Keon, MD, 40 mg at 05/21/23 0907   sodium chloride  flush (NS) 0.9 % injection 3 mL, 3 mL, Intravenous, Q12H, Gomez-Caraballo, Maria, MD, 3 mL at 05/21/23 0907   thiamine  (VITAMIN B1) tablet 100 mg, 100 mg, Oral, Daily, 100 mg at 05/21/23 0907 **OR** thiamine  (VITAMIN B1) injection 100 mg, 100 mg, Intravenous, Daily, Gomez-Caraballo, Maria, MD  Allergies: No Known Allergies  Roseline Conine, DO

## 2023-05-21 NOTE — H&P (Addendum)
 Date: 05/20/2023               Patient Name:  Aaron Blankenship MRN: [id number]  DOB: [birth year] Age / Sex: 33 y.o., male   PCP: Pcp, No         Medical Service: Internal Medicine Teaching Service         Attending Physician: Dr. Sandie Cross, MD      First Contact: Dr. Sheree Dieter MD Pager [phone]    Second Contact: Dr. Cathey Clunes, MD          After Hours (After 5p/  First Contact Pager: [phone]  weekends / holidays): Second Contact Pager: [phone]   SUBJECTIVE   Chief Complaint: Nausea vomiting, overdose  History of Present Illness: This is a 33 year old gentleman with limited contact to the medical system presenting with nausea and vomiting following as well as drug overdose.  The patient states they have a long history of prior alcohol use but have not drink much in the last several months.  They also have a history of polysubstance abuse, but deny IV drug use.  The patient states that he drank 3-4 beers last night and began having black emesis.  This morning the patient used intranasal fentanyl, and was brought into the emergency department unresponsive.  They received Narcan  and IV fluid and there mental status and vitals quickly improved.  Overall the patient states that he has had epigastric pain ongoing for several months.  The pain is worse when he eats, and endorses emesis at times.  He states he had a chocolate cake this morning which may have caused his emesis to be black, but this is unclear.  He denies throwing up frank blood.  The patient does endorse ongoing polysubstance abuse.  He rarely drinks alcohol anymore but endorses prior history of significant alcohol use.  He states he has been using Xanax for the last 2 weeks p.o. at bedtime for sleep.  He does endorse chest pain and shortness of breath which has been ongoing for several months as well.  He endorses orthostatic lightheadedness and dizziness which has been going on for several months.  He  also does endorse urinary frequency.  Denies fevers, chills, unexpected weight loss.  Denies rashes, skin color changes, jaundice, recent tattoos, history of IV drug use, diarrhea, or hematochezia.  Denies recent use of NSAIDs or over-the-counter pain medications.  ED Course: Vitals: On presentation to the ED, the patient was apneic, hypotensive, tachycardic, and hypoxic in the 50s.  This has since improved with Narcan  and IV fluids. Labs: Ethanol negative, Tylenol  negative, salicylate negative.  UDS positive for cocaine, benzodiazepines, amphetamines.  CMP demonstrating elevated creatinine to 2.0, elevated AST 738, ALT 513, anion gap of 19 with normal bicarb.  CBC remarkable for neutrophil predominant leukocytosis, white blood cell count 29.2.  UA demonstrating glucose, protein, trace leukocytes and rare bacteria.  CK within normal limits.  Lipase negative.  Repeat chemistry panel demonstrating interval decrease in creatinine.  Lactic acid elevated at 2.6. Imaging: Chest x-ray showing increased pulmonary vascular congestion without frank edema.  Right upper quadrant ultrasound demonstrating no gallstones or pericholecystic fluid with negative sonographic Murphy sign.  There was gallbladder thickening to 4.7 millimeters. EKG: Sinus tachycardia.  QTc 450 In the ED: Patient received GI cocktail, Pepcid  20 mg, 2 mg Narcan  IM, 2 mg Narcan  IV, Zofran  4 mg IV, 3 L normal saline.   Meds:  No outpatient medications have been marked as taking for the 05/20/23  encounter [facility] Encounter).    Past Medical History Denies known past medical history.  Ongoing history of polysubstance abuse, prior alcohol use, tobacco use.  Denies known history of diabetes, cardiac pathology, GI pathology.  Social:  Occupation: Roofing PCP: Has not seen primary doctor in many years. Substances: Polysubstance use, denies IV drug use active or prior.  States he has significant past medical history of alcohol use, but rarely  drinks at this time.  Uses benzodiazepines for sleep nightly.  Significant tobacco use history, 20 years roughly 1 pack/day.  Family History: Significant for diabetes and multiple family members, age of onset usually in the 66s per patient.  Allergies: Allergies as of 05/20/2023   (No Known Allergies)    Review of Systems: Please see HPI and A&P for pertinent positives and negatives.   OBJECTIVE:   Physical Exam: Blood pressure 95/68, pulse (!) 105, temperature 98.3 F (36.8 C), temperature source Oral, resp. rate 11, SpO2 100%.  Constitutional: Somnolent, but otherwise alert and oriented.  No acute distress.   Cardiovascular: Tachycardic rate and rhythm, no m/r/g Pulmonary/Chest: normal work of breathing on room air, lungs clear to auscultation bilaterally Abdominal: Focal tenderness of the epigastrium and right upper quadrant.  Significant tenderness to palpation of the left lower quadrant.  No rebound, guarding, signs of peritonitis. Extremities: No lower extremity edema appreciated bilaterally Neurological: alert & oriented Skin: warm and dry.  Decreased skin turgor Psych: Pleasant affect, concerned mood Of note, some concern that patient may be hiding personal belongings on person  Labs: CBC    Component Value Date/Time   WBC 13.8 (H) 05/21/2023 0241   RBC 5.38 05/21/2023 0241   HGB 15.4 05/21/2023 0241   HCT 46.6 05/21/2023 0241   PLT 171 05/21/2023 0241   MCV 86.6 05/21/2023 0241   MCH 28.6 05/21/2023 0241   MCHC 33.0 05/21/2023 0241   RDW 14.0 05/21/2023 0241   LYMPHSABS 1.7 05/21/2023 0241   MONOABS 0.4 05/21/2023 0241   EOSABS 0.2 05/21/2023 0241   BASOSABS 0.0 05/21/2023 0241     CMP     Component Value Date/Time   NA 135 05/21/2023 0241   K 3.6 05/21/2023 0241   CL 101 05/21/2023 0241   CO2 24 05/21/2023 0241   GLUCOSE 93 05/21/2023 0241   BUN 6 05/21/2023 0241   CREATININE 0.91 05/21/2023 0241   CALCIUM 8.4 (L) 05/21/2023 0241   PROT 6.2 (L)  05/21/2023 0241   ALBUMIN 3.1 (L) 05/21/2023 0241   AST 125 (H) 05/21/2023 0241   ALT 295 (H) 05/21/2023 0241   ALKPHOS 79 05/21/2023 0241   BILITOT 1.0 05/21/2023 0241   GFRNONAA >60 05/21/2023 0241    ASSESSMENT & PLAN:   Assessment & Plan by Problem: Principal Problem:   Opiate overdose (HCC) Active Problems:   Polysubstance abuse (HCC)   Renal insufficiency   Elevated transaminase level   Aaron Blankenship is a 33 y.o. p with significant history of polysubstance abuse and alcohol use presenting with epigastric pain, vomiting, and opioid overdose as well as ongoing chest pain and dizziness admitted for observation and further workup on hospital day 0  Epigastric pain Nausea vomiting Unclear etiology, though may be multifactorial.  Certainly has risk factors for gastritis, endorses significant epigastric pain which worsens with eating-concerning for peptic ulcer disease gastritis, esophagitis.  Lipase, less concern for pancreatitis.  He does also endorse black vomiting, though admittedly confounded by chocolate cake consumption.  Has received GI cocktail as well as Zofran .  No signs of peritonitis at this time. Plan: - Consider GI evaluation, possible EGD given alcohol use and ongoing symptoms. - Pantoprazole  40 mg daily - KUB  Transaminitis Has significant right upper quadrant pain on exam in addition to elevated liver enzymes.  He did undergo right upper quadrant ultrasound which did show some gallbladder wall thickening to 4.7 mm without surrounding fluid or sonographic Murphy sign.  No gallstones visualized.  He does have leukocytosis but no significant bilirubin elevation.  Differential would include cholecystitis, hepatitis, ischemic hepatopathy, alcoholic hepatitis.  Less concern for choledocholithiasis or cholangitis given presentation. Plan: - Trend CMP - Acute hepatitis panel - CT ab pelvis - KUB - Fib 4 score elevated, unclear utility given likely acute elevation in  transaminases. - Do have some underlying concern for fibrosis/cirrhosis given alcohol abuse history.  Given ongoing epigastric pain may benefit from endoscopic evaluation for varices.  Leukocytosis Unclear etiology, may be in part due to hemoconcentration.  Does have significant epigastric pain, right upper quadrant pain, left lower quadrant pain which may reflect infection or inflammation. Plan: - Respiratory viral panel - Trend CBC - CT ab pelvis for further evaluation  Polysubstance abuse  Opioid overdose Patient presenting acutely obtunded, apneic, hypoxic, hypotensive.  Now status post 3 L IV fluid with improvement of blood pressure as well as Narcan  IV and IM.  On our exam, patient persistently slightly drowsy but otherwise alert and oriented.  Vitals normalizing.  UDS positive for benzos, cocaine, amphetamine.  Endorses ongoing daily Xanax use for sleep.  No history of withdrawal seizure.  Some concern that patient may have illicit substances in his personal belongings. Plan: - COWS score to monitor for Xanax withdrawal - TOC consult polysubstance abuse - Encourage cessation - Would benefit from establishing with primary care doctor for ongoing care of complex needs. - As needed Narcan  - Additional 1 L NS bolus - Discussed concerns regarding patient's belongings with nursing staff.    Left lower quadrant pain Unclear etiology, patient does endorse ongoing black and green stools for some time.  Denies bright red blood per rectum.  In combination with this leukocytosis I do query something like IBD. Plan: - CT abdomen pelvis - Continue attention on exam  AKI Proteinuria Creatinine 2.0 on admission, elevated from prior.  Did improve with IV fluids.  Likely prerenal.  Does have proteinuria and glycosuria which may reflect underlying kidney dysfunction.  Less likely for ATN though patient presented with significant hypotension.  No significant casts or RBCs on exam. Plan: - Daily  chemistry panel - Additional 1 L IV fluid  Anion gap Elevated lactic Bicarbonate normal, gap elevated.  To query ingestion given history, but likely secondary to increased lactic. Plan: - Trend lactic - IV fluids - Serum osmolality, calculate gap given anion gap and liver injury.  Hyperglycemia Glycosuria Family history of type 2 diabetes, he does not see a physician regularly and generally tries to avoid the healthcare system. He does state he has had increased urinary frequency recently.  He was hyperglycemic despite poor p.o. intake.  He does have glycosuria and proteinuria on his UA.  Some concern for underlying diabetes. Plan: - Hemoglobin A1c  Shortness of breath Chest pain Vascular congestion Orthostatic dizziness Again unknown etiology.  EKG normal.  He does endorse ongoing chest pain and shortness of breath for some months as well as orthostatic dizziness.  Given polysubstance abuse, alcohol use, leukocytosis there is some concern for underlying endo-/myocarditis.  Will workup with echo and  troponins.  Diet: Clear liquid VTE: Enoxaparin  IVF: NS,Bolus Code: Full  Prior to Admission Living Arrangement: Home Anticipated Discharge Location: Home Barriers to Discharge: Workup, clinical improvement.  Dispo: Admit patient to Observation with expected length of stay less than 2 midnights.  Signed: Sheree Dieter, MD Internal Medicine Resident PGY-1  05/21/2023, 11:59 AM

## 2023-05-21 NOTE — TOC CM/SW Note (Signed)
 Transition of Care [facility]) - Inpatient Brief Assessment   Patient Details  Name: Aaron Blankenship MRN: [id number] Date of Birth: [birth year]  Transition of Care [facility]) CM/SW Contact:    Cosimo Diones, RN Phone Number: 05/21/2023, 10:08 AM   Clinical Narrative: Patient presented for unresponsiveness- due to history of polysubstance abuse. Interpreter Antony Baumgartner was utilized and patient reports that he speaks [place]. 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 Patient has no PCP and is agreeable for the CMA to arrange a hospital follow up appointment for PCP needs. Case Manager will follow for [facility] assistance for medications. Case Manager will continue to follow.    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

## 2023-05-21 NOTE — Discharge Instructions (Addendum)
 You were hospitalized for opioid overdose, nausea and vomiting. Thank you for allowing us  to be part of your care.   We arranged for you to follow up at:   Behavioral health intensive outpatient program at behavioral health urgent care- Friday, May 25, 2023 at 8:30 a.m. [address], 2nd floor at 8:30 a.m  Please see the information below to meet your new primary care doctor at 9:45 AM on May 19.  It will be important for them to meet you and continue to follow your blood work to make sure your liver numbers returned to normal and you get appropriate vaccines and follow-up.   Follow-up Information     Paseda, Folashade R, FNP Follow up on 05/28/2023.   Specialty: Nurse Practitioner Why: @ 9:45am  [address] Contact information: [address] [place] [address] [phone]         Paseda, Folashade R, FNP .   Specialty: Nurse Practitioner Contact information: [address] [place] Suite [place], [place] [address] [phone]                 Please note these changes made to your medications:    Please START taking:  Protonix  40 mg daily, this will help with some of your stomach pain Albuterol  inhaler as needed for shortness of breath and wheezing.   Please call our clinic if you have any questions or concerns, we may be able to help and keep you from a long and expensive emergency room wait. Our clinic and after hours phone number is [phone], the best time to call is Monday through Friday 9 am to 4 pm but there is always someone available 24/7 if you have an emergency. If you need medication refills please notify your pharmacy one week in advance and they will send us  a request.         ------------------------------------------------------------------------------------------------------------------------------------------   Servicios ambulatorios de tratamiento para el abuso de sustancias   [employer] [facility] para pacientes  ambulatorios  Programa Ambulatorio Intensivo  de Dependencia [place] I [address] [place], [place] [address]  [phone] Seguro privado, Medicare A&B y [facility]   ADS (Servicios de Alcohol y [place])  [address],  [place], [place] [address] [phone] Medicaid, pago por cuenta propia   Centro de [place]      [address]  [place], [place] [phone] Medicaid y seguro privado, pago por cuenta propia   El Programa Insight [address]  [place], [place]  [phone] Seguro privado y pago por cuenta propia  Saln de la Fraternidad      [address]    [place], [place] [address]  [phone] o [phone] Solo seguro privado                 Atencin de acceso total de [place] [address]  [place], [facility] [address] [phone] Medicaid, Medicare, Seguro Privado  Servicios de Consejera HEALS de [facility] en la Fundacin [place] [address]  [place], [place] [address] [phone] Los servicios son gratuitos o reducidos  Consejera Al-Con  [address] [phone]  Solo pago por Arlen Lacks propia, escala mvil  Servicios de Cuidado  [address]  [place], [place] [address] [phone] ([facility] [place]) Pago por Mallie Seal, solo Medicaid   Recursos conductuales de la trada [address], [place] [address]  [phone] Medicaid, Medicare, Seguro [address] de tratamiento para el abuso de sustancias en [place] Programa Insight [address]  [place], [place]  [phone] Pago por cuenta propia Ofrecer becas de la Fundacin [place] para ayudar a Set designer web: [email or web address]  [place] Programa de Abuso de Sustancias para Adolescentes Hombres de 12 a 17  aos Programa de Abuso de Sustancias para Adolescentes Mujeres: 12-17  Oficina del [place] de [place] [address]  [place], [place] [address] (tel) [phone]  (fax) [phone]  Oficina del [place] de [place]  [address]  [place], [place] [address] (tel) [phone]  (fax) [phone]  Oficina del [place] de [place]  [place], [place] [address] (tel) [phone]  (fax) [phone]  Oficina del [place] Ellen [address], [place], [place] [address] (tel) [phone]  (fax) [phone]  Sitio web: [email or web address]         [facility] para pacientes ambulatorios Programa Ambulatorio Intensivo de Abuso de Sustancias para Adolescentes [place]: [phone] Direccin: [address], [place], [place] Sitio web: [email or web address]    Servicios Residenciales de [place] para el Abuso de Sustancias   ARCA (Asociacin de Atencin para la Recuperacin de Adicciones)  [address] [place], [place] [address]  [phone] o [phone] Desintoxicacin (Medicare, Medicaid, seguro privado y pago por cuenta propia)  Rehabilitacin residencial [address] (Medicare, [place], seguro privado y pago por cuenta propia)   RTS (Servicios de [place] Residencial)  [address] [place], [place]  [phone]  Desintoxicacin masculina y femenina (disponibilidad limitada de pago por cuenta propia y [place])  Solo para hombres de rehabilitacin (solo [place] y pago por cuenta propia)   Saln de la Fraternidad      [address]  [place], [place] [address]  [phone] o [phone] Desintoxicacin y [place] Residencial Solo seguro privado   [place] Residencial [facility]  [address]  [place], [place] [address]  [phone]  Solo tratamiento, debe hacer una cita de  evaluacin y debe estar sobrio para la cita de evaluacin.  Solo pago por cuenta propia, Medicare A y B, Medicaid del condado de [place], identificacin de [place] solamente! *Asistencia de transporte ofrecida por [employer] [address]  TROSA     [address] [place], [place] [address] Entrevistas sin cita previa L-Sb 8-4p No hay cargos legales pendientes [phone]     ADATC: [facility] de [facility] Recomendacin  [address] [place], [place] [phone] (Pago por Mallie Seal, Medicaid)  [facility] de [place] [address] [place], [place] [address] [phone] Desintoxicacin y [place] Residencial Medicare y seguros privados  [place] de la [place] [address]  [place], [place] [address] [place] de [address] [phone] Instalaciones para hombres de [place]: [phone] Programa Residencial a [place]:  [phone] [facility] de [address] ([place], tarifa inicial)  [place]  Saln [address] del [place], [place] [address] [phone] Seguro Privado con [place], Pago Privado  Centro de Recuperacin de [place] [address] [place], [place] [address] Local [phone] Solo seguro privado  [place] [address]  [place], [place] [address]  [phone] ([place], cuota inicial)  Centro de [place] de [place] [address] [place], [address]  [phone] Seguros Privados      Ubicaciones de las misiones de rescate de [place]  Misin de rescate de [place]  [address] del [place]  [place], [place]  [phone] Faustino Hook para personas sin hogar Pago por cuenta propia, sin seguro  [place]  Programa masculino: [employer] [address]  [place], [place] [address] [phone]  Ponciano Bristle palomas Programa para mujeres: [employer] [address] [place], [place] [address] [phone] Faustino Hook para personas sin  hogar Pago por cuenta propia, sin seguro  Divisin Masculina de la Misin de Rescate de [facility] [address] [place], [place] [address]  [phone] Faustino Hook para personas sin hogar Pago por cuenta propia, sin seguro  Divisin de [place] de la Misin de Rescate de [facility] [address] [place], [place] [address] [phone] Faustino Hook para personas sin hogar Pago por cuenta propia, sin seguro

## 2023-05-22 ENCOUNTER — Encounter (HOSPITAL_COMMUNITY): Payer: Self-pay

## 2023-05-22 ENCOUNTER — Encounter: Payer: Self-pay | Admitting: Infectious Diseases

## 2023-05-22 DIAGNOSIS — Z789 Other specified health status: Secondary | ICD-10-CM | POA: Insufficient documentation

## 2023-05-22 LAB — RPR: RPR Ser Ql: NONREACTIVE

## 2023-05-23 ENCOUNTER — Encounter (HOSPITAL_COMMUNITY): Payer: Self-pay

## 2023-05-23 ENCOUNTER — Telehealth (HOSPITAL_COMMUNITY): Payer: Self-pay

## 2023-05-23 NOTE — Telephone Encounter (Signed)
 Made in error

## 2023-05-25 ENCOUNTER — Ambulatory Visit (HOSPITAL_COMMUNITY): Payer: Self-pay

## 2023-05-25 ENCOUNTER — Telehealth (HOSPITAL_COMMUNITY): Payer: Self-pay

## 2023-05-25 NOTE — Telephone Encounter (Signed)
 Called Aaron Blankenship to reschedule [facility] for 05-25-23, as there was a conflict in scheduling. Left HIPAA compliant message requesting a return call.  Earnie Gola, MS, LMFT, LCAS

## 2023-05-28 ENCOUNTER — Ambulatory Visit: Payer: Self-pay | Admitting: Nurse Practitioner

## 2023-08-03 ENCOUNTER — Ambulatory Visit (HOSPITAL_BASED_OUTPATIENT_CLINIC_OR_DEPARTMENT_OTHER): Payer: Self-pay | Admitting: Family Medicine

## 2023-09-15 ENCOUNTER — Encounter (HOSPITAL_COMMUNITY): Payer: Self-pay | Admitting: Emergency Medicine

## 2023-09-15 ENCOUNTER — Emergency Department (HOSPITAL_COMMUNITY)
Admission: EM | Admit: 2023-09-15 | Discharge: 2023-09-15 | Disposition: A | Discharge status: Home / Self Care | Payer: Self-pay | Attending: Emergency Medicine | Admitting: Emergency Medicine

## 2023-09-15 ENCOUNTER — Emergency Department (HOSPITAL_COMMUNITY): Payer: Self-pay

## 2023-09-15 ENCOUNTER — Other Ambulatory Visit: Payer: Self-pay

## 2023-09-15 DIAGNOSIS — Z7952 Long term (current) use of systemic steroids: Secondary | ICD-10-CM | POA: Insufficient documentation

## 2023-09-15 DIAGNOSIS — J45901 Unspecified asthma with (acute) exacerbation: Secondary | ICD-10-CM | POA: Insufficient documentation

## 2023-09-15 LAB — COMPREHENSIVE METABOLIC PANEL WITH GFR
ALT: 16 U/L (ref 0–44)
AST: 16 U/L (ref 15–41)
Albumin: 3.5 g/dL (ref 3.5–5.0)
Alkaline Phosphatase: 74 U/L (ref 38–126)
Anion gap: 11 (ref 5–15)
BUN: 15 mg/dL (ref 6–20)
CO2: 24 mmol/L (ref 22–32)
Calcium: 8.9 mg/dL (ref 8.9–10.3)
Chloride: 102 mmol/L (ref 98–111)
Creatinine, Ser: 0.92 mg/dL (ref 0.61–1.24)
GFR, Estimated: >60 mL/min (ref >60)
Glucose, Bld: 105 mg/dL — ABNORMAL HIGH (ref 70–99)
Potassium: 4.7 mmol/L (ref 3.5–5.1)
Sodium: 137 mmol/L (ref 135–145)
Total Bilirubin: 0.5 mg/dL (ref 0.0–1.2)
Total Protein: 6.7 g/dL (ref 6.5–8.1)

## 2023-09-15 LAB — CBC
HCT: 46.2 % (ref 39.0–52.0)
Hemoglobin: 14.2 g/dL (ref 13.0–17.0)
MCH: 26.7 pg (ref 26.0–34.0)
MCHC: 30.7 g/dL (ref 30.0–36.0)
MCV: 87 fL (ref 80.0–100.0)
Platelets: 307 K/uL (ref 150–400)
RBC: 5.31 MIL/uL (ref 4.22–5.81)
RDW: 14.3 % (ref 11.5–15.5)
WBC: 8.1 K/uL (ref 4.0–10.5)
nRBC: 0 % (ref 0.0–0.2)

## 2023-09-15 MED ORDER — IPRATROPIUM BROMIDE 0.02 % IN SOLN
0.5000 mg | RESPIRATORY_TRACT | Status: AC
  Administered 2023-09-15 (×3): 0.5 mg via RESPIRATORY_TRACT
  Filled 2023-09-15 (×3): qty 2.5

## 2023-09-15 MED ORDER — SODIUM CHLORIDE 0.9 % IV BOLUS
500.0000 mL | Freq: Once | INTRAVENOUS | Status: AC
  Administered 2023-09-15: 500 mL via INTRAVENOUS

## 2023-09-15 MED ORDER — ALBUTEROL SULFATE HFA 108 (90 BASE) MCG/ACT IN AERS: 1.0000 | INHALATION_SPRAY | Freq: Four times a day (QID) | RESPIRATORY_TRACT | 0 refills | Status: AC | PRN

## 2023-09-15 MED ORDER — METHYLPREDNISOLONE SODIUM SUCC 125 MG IJ SOLR
125.0000 mg | Freq: Once | INTRAMUSCULAR | Status: AC
  Administered 2023-09-15: 125 mg via INTRAVENOUS
  Filled 2023-09-15: qty 2

## 2023-09-15 MED ORDER — MAGNESIUM SULFATE 2 GM/50ML IV SOLN
2.0000 g | INTRAVENOUS | Status: AC
  Administered 2023-09-15: 2 g via INTRAVENOUS
  Filled 2023-09-15: qty 50

## 2023-09-15 MED ORDER — ALBUTEROL SULFATE (2.5 MG/3ML) 0.083% IN NEBU
5.0000 mg | INHALATION_SOLUTION | RESPIRATORY_TRACT | Status: AC
  Administered 2023-09-15 (×3): 5 mg via RESPIRATORY_TRACT
  Filled 2023-09-15 (×3): qty 6

## 2023-09-15 MED ORDER — PREDNISONE 20 MG PO TABS: 40.0000 mg | ORAL_TABLET | Freq: Every day | ORAL | 0 refills | Status: AC

## 2023-09-15 MED ORDER — ALBUTEROL SULFATE (2.5 MG/3ML) 0.083% IN NEBU: 3.0000 mL | INHALATION_SOLUTION | RESPIRATORY_TRACT | 6 refills | Status: AC | PRN

## 2023-09-15 MED ORDER — ALBUTEROL SULFATE HFA 108 (90 BASE) MCG/ACT IN AERS
2.0000 | INHALATION_SPRAY | Freq: Once | RESPIRATORY_TRACT | Status: AC
  Administered 2023-09-15: 2 via RESPIRATORY_TRACT
  Filled 2023-09-15: qty 6.7

## 2023-09-15 NOTE — ED Notes (Signed)
 NT ambulated pt with pulse ox pt was stating at 95% while walking

## 2023-09-15 NOTE — ED Provider Notes (Signed)
 [place] [facility] Provider Note   CSN: [id number] Arrival date & time: 09/15/23  0354     Patient presents with: Shortness of Breath and Asthma   Aaron Blankenship is a 33 y.o. male.   33 year old male with a history of asthma, polysubstance abuse, anxiety and depression presents to the emergency department for complaints of shortness of breath.  He states that he has had trouble breathing most of the day, but symptoms worsened tonight.  He did not have any medications at home to use for his symptoms.  Denies any recent fevers, associated vomiting, known sick contacts.  Denies hx of intubations 2/2 asthma exacerbation.  The history is provided by the patient. No language interpreter was used.       Prior to Admission medications   Medication Sig Start Date End Date Taking? Authorizing Provider  predniSONE  (DELTASONE ) 20 MG tablet Take 2 tablets (40 mg total) by mouth daily. 09/15/23  Yes Keith Sor, PA-C  albuterol  (PROVENTIL ) (2.5 MG/3ML) 0.083% nebulizer solution Inhale 3 mLs into the lungs every 4 (four) hours as needed for wheezing or shortness of breath. 05/21/23   Gabino Boga, MD  albuterol  (VENTOLIN  HFA) 108 (90 Base) MCG/ACT inhaler Inhale 1-2 puffs into the lungs every 6 (six) hours as needed for wheezing or shortness of breath. 11/09/20   Graham, Laura E, PA-C  pantoprazole  (PROTONIX ) 40 MG tablet Take 1 tablet (40 mg total) by mouth daily. 05/22/23   Gabino Boga, MD    Allergies: Patient has no known allergies.    Review of Systems Ten systems reviewed and are negative for acute change, except as noted in the HPI.    Updated Vital Signs BP 105/71   Pulse 69   Temp 97.6 F (36.4 C) (Oral)   Resp 10   Wt 74.8 kg   SpO2 100%   BMI 23.66 kg/m   Physical Exam Vitals and nursing note reviewed.  Constitutional:      General: He is not in acute distress.    Appearance: He is well-developed. He is not diaphoretic.      Comments: Dyspneic and tachypneic without overt distress.  HENT:     Head: Normocephalic and atraumatic.  Eyes:     General: No scleral icterus.    Conjunctiva/sclera: Conjunctivae normal.  Cardiovascular:     Rate and Rhythm: Normal rate and regular rhythm.     Pulses: Normal pulses.  Pulmonary:     Effort: Pulmonary effort is normal. No respiratory distress.     Breath sounds: Wheezing (expiratory wheeze throughout) present. No rhonchi.  Musculoskeletal:        General: Normal range of motion.     Cervical back: Normal range of motion.  Skin:    General: Skin is warm and dry.     Coloration: Skin is not pale.     Findings: No erythema or rash.  Neurological:     Mental Status: He is alert and oriented to person, place, and time.     Coordination: Coordination normal.     Comments: Moving all extremities spontaneously  Psychiatric:        Mood and Affect: Mood is anxious.        Behavior: Behavior is cooperative.     (all labs ordered are listed, but only abnormal results are displayed) Labs Reviewed  COMPREHENSIVE METABOLIC PANEL WITH GFR - Abnormal; Notable for the following components:      Result Value   Glucose, Bld  105 (*)    All other components within normal limits  CBC    EKG: EKG Interpretation Date/Time:  Saturday September 15 2023 04:14:55 EDT Ventricular Rate:  70 PR Interval:  145 QRS Duration:  90 QT Interval:  401 QTC Calculation: 433 R Axis:   98  Text Interpretation: Right and left arm electrode reversal, interpretation assumes no reversal Sinus rhythm Probable lateral infarct, age indeterminate Minimal ST elevation, anterior leads When compared with ECG of 04/19/2021, No significant change was found Confirmed by Raford Lenis ([id number]) on 09/15/2023 4:31:47 AM  Radiology: [facility] Chest Port 1 View Result Date: 09/15/2023 CLINICAL DATA:  Shortness of breath.  Asthma. EXAM: PORTABLE CHEST 1 VIEW COMPARISON:  05/20/2023 FINDINGS: The lungs are clear without  focal pneumonia, edema, pneumothorax or pleural effusion. Ill-defined nodular opacity in the left costophrenic sulcus is likely atelectatic. The cardiopericardial silhouette is within normal limits for size. No acute bony abnormality. Telemetry leads overlie the chest. IMPRESSION: Ill-defined nodular opacity in the left costophrenic sulcus is likely atelectatic. Follow-up dedicated upright PA and lateral chest x-ray recommended to ensure resolution. Electronically Signed   By: Camellia Candle M.D.   On: 09/15/2023 05:23     Procedures   Medications Ordered in the ED  albuterol  (VENTOLIN  HFA) 108 (90 Base) MCG/ACT inhaler 2 puff (has no administration in time range)  albuterol  (PROVENTIL ) (2.5 MG/3ML) 0.083% nebulizer solution 5 mg (5 mg Nebulization Given 09/15/23 0526)  ipratropium (ATROVENT ) nebulizer solution 0.5 mg (0.5 mg Nebulization Given 09/15/23 0526)  methylPREDNISolone  sodium succinate (SOLU-MEDROL ) 125 mg/2 mL injection 125 mg (125 mg Intravenous Given 09/15/23 0435)  magnesium  sulfate IVPB 2 g 50 mL (0 g Intravenous Stopped 09/15/23 0534)  sodium chloride  0.9 % bolus 500 mL (0 mLs Intravenous Stopped 09/15/23 0534)    Clinical Course as of 09/15/23 0637  Sat Sep 15, 2023  0526 Breath sounds improved on reassessment.  Respiratory rate has slowed. Patient states he is feeling better. [KH]  0636 Lungs CTAB. Patient states he is breathing at baseline. Will monitor to ensure no rebound and then complete ambulation trial. Anticipate discharge. [KH]    Clinical Course User Index [KH] Keith Sor, PA-C                                 Medical Decision Making Amount and/or Complexity of Data Reviewed Labs: ordered. Radiology: ordered.  Risk Prescription drug management.   This patient presents to the ED for concern of SOB, this involves an extensive number of treatment options, and is a complaint that carries with it a high risk of complications and morbidity.  The differential diagnosis  includes PNA vs PTX vs asthma exacerbation vs ACS vs CHF   Co morbidities that complicate the patient evaluation  Asthma Polysubstance abuse   Lab Tests:  I Ordered, and personally interpreted labs.  The pertinent results include:  Normal CBC, CMP. Prior transaminitis has resolved.   Imaging Studies ordered:  I ordered imaging studies including CXR  I independently visualized and interpreted imaging which showed findings suggestive of atelectasis. No PNA or PTX. I agree with the radiologist interpretation   Cardiac Monitoring:  The patient was maintained on a cardiac monitor.  I personally viewed and interpreted the cardiac monitored which showed an underlying rhythm of: NSR   Medicines ordered and prescription drug management:  I ordered medication including Duoneb x3, Solu-Medrol , and MgSO4 for SOB  Reevaluation of  the patient after these medicines showed that the patient improved I have reviewed the patients home medicines and have made adjustments as needed   Test Considered:  UDS   Problem List / ED Course:  As above   Reevaluation:  After the interventions noted above, I reevaluated the patient and found that they have :improved   Social Determinants of Health:  Language barrier   Dispostion:  Care signed out to [place], [place] pending reassessment.      Final diagnoses:  Exacerbation of asthma, unspecified asthma severity, unspecified whether persistent    ED Discharge Orders          Ordered    predniSONE  (DELTASONE ) 20 MG tablet  Daily        09/15/23 0634               Keith Sor, PA-C 09/15/23 [id number]    Raford Lenis, MD 09/15/23 [phone]

## 2023-09-15 NOTE — ED Provider Notes (Signed)
   Accepted handoff at shift change from Humes PA-C. Please see prior provider note for more detail.   Briefly: Patient is 33 y.o. presents to the emergency department for complaints of shortness of breath. He states that he has had trouble breathing most of the day, but symptoms worsened tonight. He did not have any medications at home to use for his symptoms. Denies any recent fevers, associated vomiting, known sick contacts. Denies hx of intubations 2/2 asthma exacerbation.  DDX: concern for  PNA vs PTX vs asthma exacerbation vs ACS vs CHF   Plan:  - dispo pending ambulation trial - patient ambulated in ED with O2 95% on RA. Patient monitored for an extra 2 hours in ED and states that he still feels good and is ready to go home. Prior provider sent in prescription for prednisone  and albuterol  for the patient. Patient to follow up with PCP.  - patient afebrile with stable vitals. Provided with return precautions. Discharged in good condition.      Aaron Blankenship, [place] 09/15/23 0815    Raford Lenis, MD 09/16/23 [phone]

## 2023-09-15 NOTE — Discharge Instructions (Addendum)
 Take prednisone  as prescribed until finished.  Continue use of albuterol  every 4-6 hours for management of wheezing, shortness of breath.  You may benefit from over-the-counter Zyrtec  or Claritin to manage any allergic component, especially given the time of year.  Follow-up with a primary care doctor.

## 2023-09-15 NOTE — ED Triage Notes (Signed)
 Pt in POV with sob, breathing labored on arrival. Audible wheezes heard, hx of asthma and has been out of inhaler.
